# Patient Record
Sex: Female | Born: 1945 | Race: White | Hispanic: No | Marital: Single | Smoking: Former smoker
Health system: Southern US, Community
[De-identification: ages and names within clinical notes are randomized; demographics above are authoritative.]

## PROBLEM LIST (undated history)

## (undated) DIAGNOSIS — J449 Chronic obstructive pulmonary disease, unspecified: Secondary | ICD-10-CM

## (undated) DIAGNOSIS — F32A Depression, unspecified: Secondary | ICD-10-CM

## (undated) DIAGNOSIS — I739 Peripheral vascular disease, unspecified: Secondary | ICD-10-CM

## (undated) DIAGNOSIS — E785 Hyperlipidemia, unspecified: Secondary | ICD-10-CM

## (undated) DIAGNOSIS — F329 Major depressive disorder, single episode, unspecified: Secondary | ICD-10-CM

## (undated) DIAGNOSIS — E119 Type 2 diabetes mellitus without complications: Secondary | ICD-10-CM

## (undated) DIAGNOSIS — I10 Essential (primary) hypertension: Secondary | ICD-10-CM

---
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
## 2018-11-08 DIAGNOSIS — E119 Type 2 diabetes mellitus without complications: Secondary | ICD-10-CM

## 2018-11-08 DIAGNOSIS — N390 Urinary tract infection, site not specified: Secondary | ICD-10-CM

## 2018-11-08 DIAGNOSIS — R531 Weakness: Secondary | ICD-10-CM | POA: Diagnosis not present

## 2018-11-08 DIAGNOSIS — I10 Essential (primary) hypertension: Secondary | ICD-10-CM | POA: Diagnosis not present

## 2018-11-09 DIAGNOSIS — N390 Urinary tract infection, site not specified: Secondary | ICD-10-CM | POA: Diagnosis not present

## 2018-11-09 DIAGNOSIS — R531 Weakness: Secondary | ICD-10-CM | POA: Diagnosis not present

## 2018-11-09 DIAGNOSIS — E119 Type 2 diabetes mellitus without complications: Secondary | ICD-10-CM | POA: Diagnosis not present

## 2018-11-09 DIAGNOSIS — I10 Essential (primary) hypertension: Secondary | ICD-10-CM | POA: Diagnosis not present

## 2018-11-10 DIAGNOSIS — R531 Weakness: Secondary | ICD-10-CM | POA: Diagnosis not present

## 2018-11-10 DIAGNOSIS — E119 Type 2 diabetes mellitus without complications: Secondary | ICD-10-CM | POA: Diagnosis not present

## 2018-11-10 DIAGNOSIS — N390 Urinary tract infection, site not specified: Secondary | ICD-10-CM | POA: Diagnosis not present

## 2018-11-10 DIAGNOSIS — I10 Essential (primary) hypertension: Secondary | ICD-10-CM | POA: Diagnosis not present

## 2018-11-11 DIAGNOSIS — I10 Essential (primary) hypertension: Secondary | ICD-10-CM | POA: Diagnosis not present

## 2018-11-11 DIAGNOSIS — E119 Type 2 diabetes mellitus without complications: Secondary | ICD-10-CM | POA: Diagnosis not present

## 2018-11-11 DIAGNOSIS — N390 Urinary tract infection, site not specified: Secondary | ICD-10-CM | POA: Diagnosis not present

## 2018-11-11 DIAGNOSIS — R531 Weakness: Secondary | ICD-10-CM | POA: Diagnosis not present

## 2018-11-12 DIAGNOSIS — I10 Essential (primary) hypertension: Secondary | ICD-10-CM | POA: Diagnosis not present

## 2018-11-12 DIAGNOSIS — N390 Urinary tract infection, site not specified: Secondary | ICD-10-CM | POA: Diagnosis not present

## 2018-11-12 DIAGNOSIS — R531 Weakness: Secondary | ICD-10-CM | POA: Diagnosis not present

## 2018-11-12 DIAGNOSIS — E119 Type 2 diabetes mellitus without complications: Secondary | ICD-10-CM | POA: Diagnosis not present

## 2018-11-13 DIAGNOSIS — I10 Essential (primary) hypertension: Secondary | ICD-10-CM | POA: Diagnosis not present

## 2018-11-13 DIAGNOSIS — E119 Type 2 diabetes mellitus without complications: Secondary | ICD-10-CM | POA: Diagnosis not present

## 2018-11-13 DIAGNOSIS — R531 Weakness: Secondary | ICD-10-CM | POA: Diagnosis not present

## 2018-11-13 DIAGNOSIS — N390 Urinary tract infection, site not specified: Secondary | ICD-10-CM | POA: Diagnosis not present

## 2019-04-26 HISTORY — PX: ABDOMINOPLASTY: SHX5355

## 2019-05-02 ENCOUNTER — Encounter (HOSPITAL_COMMUNITY): Payer: Self-pay | Admitting: Internal Medicine

## 2019-05-02 ENCOUNTER — Inpatient Hospital Stay (HOSPITAL_COMMUNITY)
Admission: AD | Admit: 2019-05-02 | Discharge: 2019-05-15 | DRG: 233 | Disposition: A | Discharge status: Skilled Nursing Facility | Payer: Medicare Other | Source: Other Acute Inpatient Hospital | Attending: Thoracic Surgery (Cardiothoracic Vascular Surgery) | Admitting: Thoracic Surgery (Cardiothoracic Vascular Surgery)

## 2019-05-02 DIAGNOSIS — I878 Other specified disorders of veins: Secondary | ICD-10-CM | POA: Diagnosis present

## 2019-05-02 DIAGNOSIS — D72829 Elevated white blood cell count, unspecified: Secondary | ICD-10-CM | POA: Diagnosis not present

## 2019-05-02 DIAGNOSIS — J449 Chronic obstructive pulmonary disease, unspecified: Secondary | ICD-10-CM | POA: Insufficient documentation

## 2019-05-02 DIAGNOSIS — N189 Chronic kidney disease, unspecified: Secondary | ICD-10-CM | POA: Diagnosis present

## 2019-05-02 DIAGNOSIS — I130 Hypertensive heart and chronic kidney disease with heart failure and stage 1 through stage 4 chronic kidney disease, or unspecified chronic kidney disease: Secondary | ICD-10-CM | POA: Diagnosis present

## 2019-05-02 DIAGNOSIS — J189 Pneumonia, unspecified organism: Secondary | ICD-10-CM | POA: Diagnosis present

## 2019-05-02 DIAGNOSIS — F329 Major depressive disorder, single episode, unspecified: Secondary | ICD-10-CM | POA: Diagnosis present

## 2019-05-02 DIAGNOSIS — Z9862 Peripheral vascular angioplasty status: Secondary | ICD-10-CM | POA: Diagnosis not present

## 2019-05-02 DIAGNOSIS — E11628 Type 2 diabetes mellitus with other skin complications: Secondary | ICD-10-CM | POA: Diagnosis not present

## 2019-05-02 DIAGNOSIS — M1990 Unspecified osteoarthritis, unspecified site: Secondary | ICD-10-CM | POA: Diagnosis present

## 2019-05-02 DIAGNOSIS — E1122 Type 2 diabetes mellitus with diabetic chronic kidney disease: Secondary | ICD-10-CM | POA: Diagnosis not present

## 2019-05-02 DIAGNOSIS — Z79899 Other long term (current) drug therapy: Secondary | ICD-10-CM | POA: Diagnosis not present

## 2019-05-02 DIAGNOSIS — I214 Non-ST elevation (NSTEMI) myocardial infarction: Principal | ICD-10-CM | POA: Diagnosis present

## 2019-05-02 DIAGNOSIS — E669 Obesity, unspecified: Secondary | ICD-10-CM | POA: Diagnosis present

## 2019-05-02 DIAGNOSIS — Z951 Presence of aortocoronary bypass graft: Secondary | ICD-10-CM | POA: Diagnosis not present

## 2019-05-02 DIAGNOSIS — Z6834 Body mass index (BMI) 34.0-34.9, adult: Secondary | ICD-10-CM | POA: Diagnosis not present

## 2019-05-02 DIAGNOSIS — I25119 Atherosclerotic heart disease of native coronary artery with unspecified angina pectoris: Secondary | ICD-10-CM | POA: Diagnosis present

## 2019-05-02 DIAGNOSIS — Z9889 Other specified postprocedural states: Secondary | ICD-10-CM | POA: Diagnosis not present

## 2019-05-02 DIAGNOSIS — J9621 Acute and chronic respiratory failure with hypoxia: Secondary | ICD-10-CM | POA: Diagnosis not present

## 2019-05-02 DIAGNOSIS — Z8249 Family history of ischemic heart disease and other diseases of the circulatory system: Secondary | ICD-10-CM

## 2019-05-02 DIAGNOSIS — N136 Pyonephrosis: Secondary | ICD-10-CM | POA: Diagnosis present

## 2019-05-02 DIAGNOSIS — Z01810 Encounter for preprocedural cardiovascular examination: Secondary | ICD-10-CM | POA: Diagnosis not present

## 2019-05-02 DIAGNOSIS — E8779 Other fluid overload: Secondary | ICD-10-CM | POA: Diagnosis not present

## 2019-05-02 DIAGNOSIS — D649 Anemia, unspecified: Secondary | ICD-10-CM | POA: Diagnosis not present

## 2019-05-02 DIAGNOSIS — I739 Peripheral vascular disease, unspecified: Secondary | ICD-10-CM

## 2019-05-02 DIAGNOSIS — I10 Essential (primary) hypertension: Secondary | ICD-10-CM | POA: Diagnosis present

## 2019-05-02 DIAGNOSIS — R001 Bradycardia, unspecified: Secondary | ICD-10-CM | POA: Diagnosis not present

## 2019-05-02 DIAGNOSIS — L89899 Pressure ulcer of other site, unspecified stage: Secondary | ICD-10-CM | POA: Diagnosis present

## 2019-05-02 DIAGNOSIS — I2510 Atherosclerotic heart disease of native coronary artery without angina pectoris: Secondary | ICD-10-CM

## 2019-05-02 DIAGNOSIS — D62 Acute posthemorrhagic anemia: Secondary | ICD-10-CM | POA: Diagnosis not present

## 2019-05-02 DIAGNOSIS — K219 Gastro-esophageal reflux disease without esophagitis: Secondary | ICD-10-CM | POA: Diagnosis not present

## 2019-05-02 DIAGNOSIS — N1330 Unspecified hydronephrosis: Secondary | ICD-10-CM | POA: Diagnosis not present

## 2019-05-02 DIAGNOSIS — M1711 Unilateral primary osteoarthritis, right knee: Secondary | ICD-10-CM | POA: Diagnosis not present

## 2019-05-02 DIAGNOSIS — L8990 Pressure ulcer of unspecified site, unspecified stage: Secondary | ICD-10-CM | POA: Insufficient documentation

## 2019-05-02 DIAGNOSIS — E1140 Type 2 diabetes mellitus with diabetic neuropathy, unspecified: Secondary | ICD-10-CM | POA: Diagnosis present

## 2019-05-02 DIAGNOSIS — F32A Depression, unspecified: Secondary | ICD-10-CM | POA: Insufficient documentation

## 2019-05-02 DIAGNOSIS — E1151 Type 2 diabetes mellitus with diabetic peripheral angiopathy without gangrene: Secondary | ICD-10-CM | POA: Diagnosis present

## 2019-05-02 DIAGNOSIS — R0602 Shortness of breath: Secondary | ICD-10-CM | POA: Diagnosis present

## 2019-05-02 DIAGNOSIS — Z794 Long term (current) use of insulin: Secondary | ICD-10-CM | POA: Diagnosis not present

## 2019-05-02 DIAGNOSIS — I5032 Chronic diastolic (congestive) heart failure: Secondary | ICD-10-CM | POA: Diagnosis present

## 2019-05-02 DIAGNOSIS — R5381 Other malaise: Secondary | ICD-10-CM | POA: Diagnosis not present

## 2019-05-02 DIAGNOSIS — E119 Type 2 diabetes mellitus without complications: Secondary | ICD-10-CM | POA: Diagnosis not present

## 2019-05-02 DIAGNOSIS — E785 Hyperlipidemia, unspecified: Secondary | ICD-10-CM | POA: Diagnosis present

## 2019-05-02 DIAGNOSIS — E860 Dehydration: Secondary | ICD-10-CM | POA: Diagnosis not present

## 2019-05-02 DIAGNOSIS — Z1159 Encounter for screening for other viral diseases: Secondary | ICD-10-CM

## 2019-05-02 DIAGNOSIS — I252 Old myocardial infarction: Secondary | ICD-10-CM

## 2019-05-02 DIAGNOSIS — I110 Hypertensive heart disease with heart failure: Secondary | ICD-10-CM | POA: Diagnosis not present

## 2019-05-02 DIAGNOSIS — J440 Chronic obstructive pulmonary disease with acute lower respiratory infection: Secondary | ICD-10-CM | POA: Diagnosis not present

## 2019-05-02 DIAGNOSIS — Z880 Allergy status to penicillin: Secondary | ICD-10-CM

## 2019-05-02 DIAGNOSIS — I25110 Atherosclerotic heart disease of native coronary artery with unstable angina pectoris: Secondary | ICD-10-CM | POA: Diagnosis not present

## 2019-05-02 DIAGNOSIS — J9601 Acute respiratory failure with hypoxia: Secondary | ICD-10-CM | POA: Insufficient documentation

## 2019-05-02 DIAGNOSIS — N179 Acute kidney failure, unspecified: Secondary | ICD-10-CM | POA: Diagnosis present

## 2019-05-02 DIAGNOSIS — N12 Tubulo-interstitial nephritis, not specified as acute or chronic: Secondary | ICD-10-CM | POA: Diagnosis not present

## 2019-05-02 DIAGNOSIS — I509 Heart failure, unspecified: Secondary | ICD-10-CM | POA: Diagnosis not present

## 2019-05-02 DIAGNOSIS — J9811 Atelectasis: Secondary | ICD-10-CM

## 2019-05-02 DIAGNOSIS — K5900 Constipation, unspecified: Secondary | ICD-10-CM | POA: Diagnosis not present

## 2019-05-02 DIAGNOSIS — Z87891 Personal history of nicotine dependence: Secondary | ICD-10-CM

## 2019-05-02 HISTORY — DX: Depression, unspecified: F32.A

## 2019-05-02 HISTORY — DX: Peripheral vascular disease, unspecified: I73.9

## 2019-05-02 HISTORY — DX: Chronic obstructive pulmonary disease, unspecified: J44.9

## 2019-05-02 HISTORY — DX: Hyperlipidemia, unspecified: E78.5

## 2019-05-02 HISTORY — DX: Essential (primary) hypertension: I10

## 2019-05-02 HISTORY — DX: Type 2 diabetes mellitus without complications: E11.9

## 2019-05-02 MED ORDER — BISOPROLOL FUMARATE 5 MG PO TABS
2.5000 mg | ORAL_TABLET | Freq: Every day | ORAL | Status: DC
  Administered 2019-05-03 – 2019-05-08 (×6): 2.5 mg via ORAL
  Filled 2019-05-02 (×6): qty 1

## 2019-05-02 MED ORDER — IPRATROPIUM-ALBUTEROL 20-100 MCG/ACT IN AERS: 1.0000 | INHALATION_SPRAY | Freq: Four times a day (QID) | RESPIRATORY_TRACT | Status: DC | PRN

## 2019-05-02 MED ORDER — SENNOSIDES-DOCUSATE SODIUM 8.6-50 MG PO TABS
1.0000 | ORAL_TABLET | Freq: Every day | ORAL | Status: DC
  Administered 2019-05-03 – 2019-05-08 (×6): 1 via ORAL
  Filled 2019-05-02 (×7): qty 1

## 2019-05-02 MED ORDER — LISINOPRIL 40 MG PO TABS: 40.0000 mg | ORAL_TABLET | Freq: Every day | ORAL | Status: DC

## 2019-05-02 MED ORDER — INSULIN ASPART 100 UNIT/ML ~~LOC~~ SOLN
3.0000 [IU] | Freq: Three times a day (TID) | SUBCUTANEOUS | Status: DC
  Administered 2019-05-03 – 2019-05-08 (×13): 3 [IU] via SUBCUTANEOUS

## 2019-05-02 MED ORDER — ASPIRIN EC 81 MG PO TBEC
81.0000 mg | DELAYED_RELEASE_TABLET | Freq: Every day | ORAL | Status: DC
  Administered 2019-05-03 – 2019-05-07 (×5): 81 mg via ORAL
  Filled 2019-05-02 (×6): qty 1

## 2019-05-02 MED ORDER — POLYETHYLENE GLYCOL 3350 17 G PO PACK
17.0000 g | PACK | Freq: Every day | ORAL | Status: DC
  Administered 2019-05-04: 17 g via ORAL
  Filled 2019-05-02 (×6): qty 1

## 2019-05-02 MED ORDER — ACETAMINOPHEN 650 MG RE SUPP
650.0000 mg | Freq: Four times a day (QID) | RECTAL | Status: DC | PRN
  Administered 2019-05-03: 650 mg via RECTAL
  Filled 2019-05-02: qty 1

## 2019-05-02 MED ORDER — INSULIN ASPART 100 UNIT/ML ~~LOC~~ SOLN
0.0000 [IU] | Freq: Three times a day (TID) | SUBCUTANEOUS | Status: DC
  Administered 2019-05-03: 2 [IU] via SUBCUTANEOUS
  Administered 2019-05-03: 1 [IU] via SUBCUTANEOUS
  Administered 2019-05-03: 2 [IU] via SUBCUTANEOUS
  Administered 2019-05-04: 3 [IU] via SUBCUTANEOUS
  Administered 2019-05-04: 2 [IU] via SUBCUTANEOUS
  Administered 2019-05-04 – 2019-05-05 (×2): 3 [IU] via SUBCUTANEOUS
  Administered 2019-05-05 (×2): 2 [IU] via SUBCUTANEOUS
  Administered 2019-05-06 – 2019-05-07 (×4): 3 [IU] via SUBCUTANEOUS

## 2019-05-02 MED ORDER — CLOPIDOGREL BISULFATE 75 MG PO TABS
75.0000 mg | ORAL_TABLET | Freq: Every day | ORAL | Status: DC
  Administered 2019-05-03 – 2019-05-07 (×5): 75 mg via ORAL
  Filled 2019-05-02 (×5): qty 1

## 2019-05-02 MED ORDER — ACETAMINOPHEN 325 MG PO TABS
650.0000 mg | ORAL_TABLET | Freq: Four times a day (QID) | ORAL | Status: DC | PRN
  Administered 2019-05-06 – 2019-05-08 (×3): 650 mg via ORAL
  Filled 2019-05-02 (×3): qty 2

## 2019-05-02 MED ORDER — ROSUVASTATIN CALCIUM 20 MG PO TABS
20.0000 mg | ORAL_TABLET | Freq: Every day | ORAL | Status: DC
  Administered 2019-05-03 – 2019-05-07 (×6): 20 mg via ORAL
  Filled 2019-05-02 (×6): qty 1

## 2019-05-02 MED ORDER — INSULIN ASPART 100 UNIT/ML ~~LOC~~ SOLN: 0.0000 [IU] | Freq: Every day | SUBCUTANEOUS | Status: DC

## 2019-05-02 NOTE — H&P (Addendum)
Date: 05/03/2019               Patient Name:  Stephanie [name] MRN: [id number]  DOB: Feb 19, 1946 Age / Sex: 73 y.o., female   PCP: Hedwig Morton, NP         Medical Service: Internal Medicine Teaching Service         Attending Physician: Dr. Aldine Contes, MD    First Contact: Dr. Myrtie Hawk Pager: [phone]  Second Contact: Dr. Maricela Bo Pager: [phone]       After Hours (After 5p/  First Contact Pager: [phone]  weekends / holidays): Second Contact Pager: [phone]   Chief Complaint: shortness of breath   History of Present Illness:  Stephanie [name]. [name] is a 73 yo female living at [employer] with CHF (unclear diastolic vs. Systolic), right knee arthritis, UTIs, HTN, COPD, PVD with recent right leg angioplasty, and TIIDM and history of PUD admitted to [facility] earlier today with three days of feeling weak and having shortness of breath. At [facility], initial covid testing was negative, her troponin was found to be elevated, and heparin drip was started. Repeat COVID testing was ordered as there is a recent covid outbreak at [facility]. This was negative. She was transferred to [facility] referral. After transfer, the IMTS service was called to see the patient.   [name] states that for the last 3 days to week she has been having ongoing shortness of breath and weakness without cough. She denies chest pain, nausea, tingling or pain in her arm or jaw. She denies orthopnea. She is not sure how far she can walk although she knows its decreased from usual. She denies changes in urination, dysuria, or decreased or increased frequency. She denies changes in bowel movements although she feels bloated whenever she eats. She last had a bowel movement yesterday.  She has a dressing in place over her right upper thigh and states one week ago she thinks she had an angioplasty with stents placed in her RLE as she had an arterial clot there. She was on plavis for  this and recently completed the course several days ago, she is not sure if she is taking aspirin. She denies right or left leg pain but does have chronic right knee pain that is very tender. She denies numbness in her LEs.   Further history obtained from [place] chart review. For complete details see chart pages in media tab or physical chart in 2W. Prior to admission she was treated with clindamycin and doxycycline for several days for suspected pneumonia. Once brought to [facility] ED she was treated with one dose of cefepime for leukocytosis in addition to medications mentioned above.   Social:  She lives at [employer].  She stopped smoking in 1984.  She occasionally has a bud ice, which helps settle her stomach.   Family History:  Parents - both died from heart disease at older ages  Son - died from MI at 26   Meds:  No outpatient medications have been marked as taking for the 05/02/19 encounter [facility] Encounter).  Topiramate 25 mg po hs sumatriptain succinate 50 mg po as directed crestor 10 mg qd  Paroxetine er 75 mg qam Mupirocin top daily  Lisinopril 40 mg bid  xultophy 16U qd Gabapentin 600 bid  Fexofenadine 180 mg qd Famotidine 10 mg qd Diclofenac gel plavix 75 mg qd Bisoprolol fumarate 2.5 mg qd Benzethonium chloride topical qd  Asa 81 mg qd Meclizine 12.5 mg q6h prn dizziness Hydralazine 25 mg q8h flonase 2 sprays qd Acetaminophen 650 mg q6h prn   Allergies: Allergies as of 05/02/2019 - never reviewed  Allergen Reaction Noted  . Penicillins  05/02/2019   Past Medical History:  Diagnosis Date  . COPD (chronic obstructive pulmonary disease) (HCC)   . Depression   . Diabetes mellitus without complication (HCC)   . HTN (hypertension)     Review of Systems: A complete ROS was negative except as per HPI.   Physical Exam: Blood pressure (!) 146/60, pulse 85, temperature 98.3 F (36.8 C), temperature source Oral, resp. rate 16, weight 79.4  kg, SpO2 98 %.  Constitution: NAD, supine in bed  HENT: [place]/AT Eyes: eom intact, no icterus or injection  Cardio: RRR, no m/r/g, no JVD, no LE edema, extremities warm, pedal pulses not palpated Respiratory: CTA, no wheezing rales or rhonchi, non-labored breathing, on RA  Abdominal: soft, non-distended, diffusely TTP MSK: moving all extremities, right knee TTP lateral and medially Neuro: a&ox4, normal affect  Skin: chronic LE changes, 2 left LE ulcers without purulence of erythema, 1.5x1.5cm  Troponin: 1.35 > 1.74 Pro-BNP: 2,310  EKG: personally reviewed my interpretation is LVH, st elevation aVR, depression aVL, T-wave changes lateral leads.   [facility]: Image not available  Text: No evidence of acute disease  [facility] not [facility] Borderline hydronephrosis right kidney. Delayed visualization of the right renal collection system and ureter. No calculi but question recent passage. Pyelonephritis cannot be ruled out. No perinephric fluids, stranding, or abscess.  Rectum distended with stool   UA [place]: few bacteria, nitrate negative, leukocyte negative   Assessment & Plan by Problem: Principal Problem:   NSTEMI (non-ST elevated myocardial infarction) (HCC) Active Problems:   COPD (chronic obstructive pulmonary disease) (HCC)   Depression   Diabetes mellitus without complication (HCC)   HTN (hypertension)   Acute on chronic respiratory failure with hypoxia (HCC)   Leukocytosis   Acute hypoxemic respiratory failure (HCC)  73 yo female living at [facility] with CHF (unclear diastolic vs. Systolic), right knee arthritis, UTIs, HTN, COPD, PVD with recent right leg angioplasty, and TIIDM and history of PUD admitted [facility] earlier today for leukocytosis and transferred to [facility] for NSTEMI.   NSTEMI CHF [facility]ed [facility]. Asymptomatic with st elevation and T wave changes on EKG and initial troponin elevation 1.35>1.74.  History of CHF, HTN, and peripheral vascular disease.   - trend troponins - consult cardiology  - repeat EKG - EKG q6h - cont. Heparin  - lipid panel  - ECHO  - cont. crestor  Hypertension PVD With Recent Right Angioplasty Blood pressure mildly elevated.   - cont. Home Bisoprolol 2.5 mg qd  - hold hydralazine - hold lisinopril with CKD - cont. Asa 81 mg - plavix 75 mg qd   Leukocytosis Acute Kidney Injury Possible pyelonephritis with hydronephrosis 2/2 recent obstruction on CT, AKI present likely post-renal. Lab values inconsistent with covid and symptoms atypical except for shortness of breath which may be secondary to nstemi. Placed on droplet/contact precautions while repeat testing pending. She also has two LLE ulcers 1.5x1.5cm that do not appear infected.   - f/u repeat covid  - start meropenem with broad coverage for pyelonephritis with possible obstruction  - repeat UA - Ur Na, Ur Cr - CBC, CMP - blood cultures, urine culture     Type II DM  Takes  xultophy 16U   - SSI qhs  - SSI sensitive, 3U tid - hemoglobin A1C  Constipation - miralax, senokot   COPD - duonebs q6h prn. Not on an inhaler at home GERD - cont. Home pepcid    Diet: NPO  VTE: heparin IVF: none Code: full   Dispo: Admit patient to Inpatient with expected length of stay greater than 2 midnights.  SignedVersie Starks: Latara Micheli A, DO 05/03/2019, 4:13 AM  Pager: [phone]

## 2019-05-03 ENCOUNTER — Other Ambulatory Visit: Payer: Self-pay

## 2019-05-03 ENCOUNTER — Encounter (HOSPITAL_COMMUNITY): Payer: Self-pay

## 2019-05-03 ENCOUNTER — Inpatient Hospital Stay (HOSPITAL_COMMUNITY): Payer: Medicare Other

## 2019-05-03 DIAGNOSIS — Z9862 Peripheral vascular angioplasty status: Secondary | ICD-10-CM

## 2019-05-03 DIAGNOSIS — K219 Gastro-esophageal reflux disease without esophagitis: Secondary | ICD-10-CM

## 2019-05-03 DIAGNOSIS — I110 Hypertensive heart disease with heart failure: Secondary | ICD-10-CM

## 2019-05-03 DIAGNOSIS — K5900 Constipation, unspecified: Secondary | ICD-10-CM

## 2019-05-03 DIAGNOSIS — E1151 Type 2 diabetes mellitus with diabetic peripheral angiopathy without gangrene: Secondary | ICD-10-CM

## 2019-05-03 DIAGNOSIS — Z8711 Personal history of peptic ulcer disease: Secondary | ICD-10-CM

## 2019-05-03 DIAGNOSIS — Z87440 Personal history of urinary (tract) infections: Secondary | ICD-10-CM

## 2019-05-03 DIAGNOSIS — L8990 Pressure ulcer of unspecified site, unspecified stage: Secondary | ICD-10-CM | POA: Insufficient documentation

## 2019-05-03 DIAGNOSIS — M1711 Unilateral primary osteoarthritis, right knee: Secondary | ICD-10-CM

## 2019-05-03 DIAGNOSIS — I509 Heart failure, unspecified: Secondary | ICD-10-CM

## 2019-05-03 DIAGNOSIS — Z880 Allergy status to penicillin: Secondary | ICD-10-CM

## 2019-05-03 DIAGNOSIS — R0602 Shortness of breath: Secondary | ICD-10-CM

## 2019-05-03 DIAGNOSIS — Z87891 Personal history of nicotine dependence: Secondary | ICD-10-CM

## 2019-05-03 DIAGNOSIS — N1330 Unspecified hydronephrosis: Secondary | ICD-10-CM

## 2019-05-03 DIAGNOSIS — D72829 Elevated white blood cell count, unspecified: Secondary | ICD-10-CM

## 2019-05-03 DIAGNOSIS — Z794 Long term (current) use of insulin: Secondary | ICD-10-CM

## 2019-05-03 DIAGNOSIS — I214 Non-ST elevation (NSTEMI) myocardial infarction: Principal | ICD-10-CM

## 2019-05-03 DIAGNOSIS — J449 Chronic obstructive pulmonary disease, unspecified: Secondary | ICD-10-CM

## 2019-05-03 DIAGNOSIS — J9621 Acute and chronic respiratory failure with hypoxia: Secondary | ICD-10-CM

## 2019-05-03 DIAGNOSIS — N179 Acute kidney failure, unspecified: Secondary | ICD-10-CM

## 2019-05-03 DIAGNOSIS — Z79899 Other long term (current) drug therapy: Secondary | ICD-10-CM

## 2019-05-03 LAB — CBC
HCT: 28.2 % — ABNORMAL LOW (ref 36.0–46.0)
Hemoglobin: 9.3 g/dL — ABNORMAL LOW (ref 12.0–15.0)
MCH: 30.6 pg (ref 26.0–34.0)
MCHC: 33 g/dL (ref 30.0–36.0)
MCV: 92.8 fL (ref 80.0–100.0)
Platelets: 277 10*3/uL (ref 150–400)
RBC: 3.04 MIL/uL — ABNORMAL LOW (ref 3.87–5.11)
RDW: 12.2 % (ref 11.5–15.5)
WBC: 16.5 10*3/uL — ABNORMAL HIGH (ref 4.0–10.5)
nRBC: 0 % (ref 0.0–0.2)

## 2019-05-03 LAB — COMPREHENSIVE METABOLIC PANEL
ALT: 28 U/L (ref 0–44)
AST: 24 U/L (ref 15–41)
Albumin: 2.9 g/dL — ABNORMAL LOW (ref 3.5–5.0)
Alkaline Phosphatase: 79 U/L (ref 38–126)
Anion gap: 10 (ref 5–15)
BUN: 30 mg/dL — ABNORMAL HIGH (ref 8–23)
CO2: 21 mmol/L — ABNORMAL LOW (ref 22–32)
Calcium: 9.2 mg/dL (ref 8.9–10.3)
Chloride: 102 mmol/L (ref 98–111)
Creatinine, Ser: 1.61 mg/dL — ABNORMAL HIGH (ref 0.44–1.00)
GFR calc Af Amer: 37 mL/min — ABNORMAL LOW (ref >60)
GFR calc non Af Amer: 32 mL/min — ABNORMAL LOW (ref >60)
Glucose, Bld: 165 mg/dL — ABNORMAL HIGH (ref 70–99)
Potassium: 4.1 mmol/L (ref 3.5–5.1)
Sodium: 133 mmol/L — ABNORMAL LOW (ref 135–145)
Total Bilirubin: 0.6 mg/dL (ref 0.3–1.2)
Total Protein: 6.6 g/dL (ref 6.5–8.1)

## 2019-05-03 LAB — LIPID PANEL
Cholesterol: 104 mg/dL (ref 0–200)
HDL: 38 mg/dL — ABNORMAL LOW (ref >40)
LDL Cholesterol: 37 mg/dL (ref 0–99)
Total CHOL/HDL Ratio: 2.7 RATIO
Triglycerides: 145 mg/dL (ref <150)
VLDL: 29 mg/dL (ref 0–40)

## 2019-05-03 LAB — CBC WITH DIFFERENTIAL/PLATELET
Abs Immature Granulocytes: 0.05 10*3/uL (ref 0.00–0.07)
Basophils Absolute: 0 10*3/uL (ref 0.0–0.1)
Basophils Relative: 0 %
Eosinophils Absolute: 0.2 10*3/uL (ref 0.0–0.5)
Eosinophils Relative: 1 %
HCT: 30.7 % — ABNORMAL LOW (ref 36.0–46.0)
Hemoglobin: 10.1 g/dL — ABNORMAL LOW (ref 12.0–15.0)
Immature Granulocytes: 0 %
Lymphocytes Relative: 12 %
Lymphs Abs: 1.8 10*3/uL (ref 0.7–4.0)
MCH: 30.6 pg (ref 26.0–34.0)
MCHC: 32.9 g/dL (ref 30.0–36.0)
MCV: 93 fL (ref 80.0–100.0)
Monocytes Absolute: 1.3 10*3/uL — ABNORMAL HIGH (ref 0.1–1.0)
Monocytes Relative: 8 %
Neutro Abs: 12.7 10*3/uL — ABNORMAL HIGH (ref 1.7–7.7)
Neutrophils Relative %: 79 %
Platelets: 287 10*3/uL (ref 150–400)
RBC: 3.3 MIL/uL — ABNORMAL LOW (ref 3.87–5.11)
RDW: 12.3 % (ref 11.5–15.5)
WBC: 16.1 10*3/uL — ABNORMAL HIGH (ref 4.0–10.5)
nRBC: 0 % (ref 0.0–0.2)

## 2019-05-03 LAB — BASIC METABOLIC PANEL
Anion gap: 9 (ref 5–15)
BUN: 29 mg/dL — ABNORMAL HIGH (ref 8–23)
CO2: 20 mmol/L — ABNORMAL LOW (ref 22–32)
Calcium: 9.1 mg/dL (ref 8.9–10.3)
Chloride: 105 mmol/L (ref 98–111)
Creatinine, Ser: 1.48 mg/dL — ABNORMAL HIGH (ref 0.44–1.00)
GFR calc Af Amer: 41 mL/min — ABNORMAL LOW (ref >60)
GFR calc non Af Amer: 35 mL/min — ABNORMAL LOW (ref >60)
Glucose, Bld: 179 mg/dL — ABNORMAL HIGH (ref 70–99)
Potassium: 4.3 mmol/L (ref 3.5–5.1)
Sodium: 134 mmol/L — ABNORMAL LOW (ref 135–145)

## 2019-05-03 LAB — URINALYSIS, ROUTINE W REFLEX MICROSCOPIC
Bilirubin Urine: NEGATIVE
Glucose, UA: >=500 mg/dL — AB
Hgb urine dipstick: NEGATIVE
Ketones, ur: NEGATIVE mg/dL
Leukocytes,Ua: NEGATIVE
Nitrite: NEGATIVE
Protein, ur: NEGATIVE mg/dL
Specific Gravity, Urine: 1.02 (ref 1.005–1.030)
pH: 6 (ref 5.0–8.0)

## 2019-05-03 LAB — SARS CORONAVIRUS 2 BY RT PCR (HOSPITAL ORDER, PERFORMED IN ~~LOC~~ HOSPITAL LAB): SARS Coronavirus 2: NEGATIVE

## 2019-05-03 LAB — ECHOCARDIOGRAM COMPLETE: Weight: 2800 oz

## 2019-05-03 LAB — HEPARIN LEVEL (UNFRACTIONATED)
Heparin Unfractionated: <0.1 IU/mL — ABNORMAL LOW (ref 0.30–0.70)
Heparin Unfractionated: <0.1 IU/mL — ABNORMAL LOW (ref 0.30–0.70)
Heparin Unfractionated: 0.17 IU/mL — ABNORMAL LOW (ref 0.30–0.70)

## 2019-05-03 LAB — MAGNESIUM: Magnesium: 1.9 mg/dL (ref 1.7–2.4)

## 2019-05-03 LAB — CREATININE, URINE, RANDOM: Creatinine, Urine: 72.24 mg/dL

## 2019-05-03 LAB — TROPONIN I (HIGH SENSITIVITY)
Troponin I (High Sensitivity): 609 ng/L (ref <18)
Troponin I (High Sensitivity): 755 ng/L (ref <18)

## 2019-05-03 LAB — GLUCOSE, CAPILLARY
Glucose-Capillary: 132 mg/dL — ABNORMAL HIGH (ref 70–99)
Glucose-Capillary: 139 mg/dL — ABNORMAL HIGH (ref 70–99)
Glucose-Capillary: 151 mg/dL — ABNORMAL HIGH (ref 70–99)
Glucose-Capillary: 164 mg/dL — ABNORMAL HIGH (ref 70–99)
Glucose-Capillary: 184 mg/dL — ABNORMAL HIGH (ref 70–99)

## 2019-05-03 LAB — HEMOGLOBIN A1C
Hgb A1c MFr Bld: 7.4 % — ABNORMAL HIGH (ref 4.8–5.6)
Mean Plasma Glucose: 165.68 mg/dL

## 2019-05-03 LAB — GRAM STAIN

## 2019-05-03 LAB — MRSA PCR SCREENING: MRSA by PCR: NEGATIVE

## 2019-05-03 LAB — SODIUM, URINE, RANDOM: Sodium, Ur: 75 mmol/L

## 2019-05-03 MED ORDER — GABAPENTIN 100 MG PO CAPS
200.0000 mg | ORAL_CAPSULE | Freq: Two times a day (BID) | ORAL | Status: DC | PRN
  Administered 2019-05-06: 200 mg via ORAL
  Filled 2019-05-03: qty 2

## 2019-05-03 MED ORDER — SODIUM CHLORIDE 0.9 % IV SOLN
1.0000 g | INTRAVENOUS | Status: DC
  Administered 2019-05-04 – 2019-05-10 (×5): 1 g via INTRAVENOUS
  Filled 2019-05-03 (×2): qty 10
  Filled 2019-05-03 (×3): qty 1
  Filled 2019-05-03 (×2): qty 10
  Filled 2019-05-03: qty 1

## 2019-05-03 MED ORDER — HEPARIN (PORCINE) 25000 UT/250ML-% IV SOLN
1900.0000 [IU]/h | INTRAVENOUS | Status: DC
  Administered 2019-05-03: 1400 [IU]/h via INTRAVENOUS
  Administered 2019-05-03: 800 [IU]/h via INTRAVENOUS
  Administered 2019-05-04: 1600 [IU]/h via INTRAVENOUS
  Administered 2019-05-05: 1700 [IU]/h via INTRAVENOUS
  Administered 2019-05-06 – 2019-05-07 (×2): 1900 [IU]/h via INTRAVENOUS
  Filled 2019-05-03 (×6): qty 250

## 2019-05-03 MED ORDER — IPRATROPIUM-ALBUTEROL 0.5-2.5 (3) MG/3ML IN SOLN: 3.0000 mL | Freq: Four times a day (QID) | RESPIRATORY_TRACT | Status: DC | PRN

## 2019-05-03 MED ORDER — TOPIRAMATE 25 MG PO TABS
25.0000 mg | ORAL_TABLET | Freq: Every day | ORAL | Status: DC
  Administered 2019-05-03 – 2019-05-08 (×6): 25 mg via ORAL
  Filled 2019-05-03 (×7): qty 1

## 2019-05-03 MED ORDER — HEPARIN BOLUS VIA INFUSION
2000.0000 [IU] | Freq: Once | INTRAVENOUS | Status: AC
  Administered 2019-05-03: 2000 [IU] via INTRAVENOUS
  Filled 2019-05-03: qty 2000

## 2019-05-03 MED ORDER — HEPARIN BOLUS VIA INFUSION
1000.0000 [IU] | Freq: Once | INTRAVENOUS | Status: AC
  Administered 2019-05-03: 1000 [IU] via INTRAVENOUS
  Filled 2019-05-03: qty 1000

## 2019-05-03 MED ORDER — SODIUM CHLORIDE 0.9 % IV SOLN
1.0000 g | Freq: Two times a day (BID) | INTRAVENOUS | Status: AC
  Administered 2019-05-03 (×3): 1 g via INTRAVENOUS
  Filled 2019-05-03 (×3): qty 1

## 2019-05-03 MED ORDER — FAMOTIDINE 20 MG PO TABS
10.0000 mg | ORAL_TABLET | Freq: Every day | ORAL | Status: DC
  Administered 2019-05-03 – 2019-05-08 (×6): 10 mg via ORAL
  Filled 2019-05-03 (×6): qty 1

## 2019-05-03 NOTE — Progress Notes (Signed)
  Echocardiogram 2D Echocardiogram has been performed.  Stephanie [name] 05/03/2019, 8:58 AM

## 2019-05-03 NOTE — Consult Note (Addendum)
Cardiology Consultation:   Patient ID: Stephanie [name] MRN: [id number]; DOB: 1946-08-16  Admit date: 05/02/2019 Date of Consult: 05/03/2019  Primary Care Provider: Levin ErpJones, Brittney M, NP Primary Cardiologist: Dr. Anne FuSkains Primary Electrophysiologist:  None    Patient Profile:   Stephanie [name] is a 73 y.o. female with a hx of CHF, UTIs, arthritis, HTN, COPD, PVD right leg with recent angioplasty on Plavix , Type 2 DM who is being seen today for the evaluation of elevated troponin at the request of Dr. Heide SparkNarendra  History of Present Illness:   Stephanie [name] is a resident at [facility]. She noted worsening sob 4 days ago as well as weakness and was [facility]. She was being treated for pneumonia on abx. She denied any chest pain or orthopnea.  COVID test negative. Leukocytes were elevated at 16 and abdominal CT suggested pylonephritis and was therefore started on abx. BNP was 2310.  Troponin was elevated at 755. EKG showed signs of ischemia T wave inversion in I and avL and was [facility]. She was started on heparin drip and treated with ASA, bisoprolol, and plavix.   Creatinine has improved 1.61>>1.48. Troponin trending down 755>>609. Echo pending.   Patient is a poor historian. She states she is feeling better today, improved sob. She denies chest pain or palpitations. She denies recent leg edema. [place].  Heart Pathway Score:     Past Medical History:  Diagnosis Date  . COPD (chronic obstructive pulmonary disease) (HCC)   . Depression   . Diabetes mellitus without complication (HCC)   . HTN (hypertension)   . Hyperlipidemia   . PVD (peripheral vascular disease) (HCC)     Past Surgical History:  Procedure Laterality Date  . ABDOMINOPLASTY Right 04/26/2019   Verbalized per patient      Home Medications:  Prior to Admission medications   Not on File    Inpatient Medications: Scheduled Meds:  . aspirin EC  81 mg Oral Daily  . bisoprolol  2.5 mg Oral Daily  . clopidogrel  75 mg Oral Daily  . famotidine  10 mg Oral Daily  . insulin aspart  0-5 Units Subcutaneous QHS  . insulin aspart  0-9 Units Subcutaneous TID WC  . insulin aspart  3 Units Subcutaneous TID WC  . polyethylene glycol  17 g Oral Daily  . rosuvastatin  20 mg Oral q1800  . senna-docusate  1 tablet Oral QHS  . topiramate  25 mg Oral QHS   Continuous Infusions: . [START ON 05/04/2019] cefTRIAXone (ROCEPHIN)  IV    . heparin 1,400 Units/hr (05/03/19 1211)  . meropenem (MERREM) IV 1 g (05/03/19 0858)   PRN Meds: acetaminophen **OR** acetaminophen, gabapentin, ipratropium-albuterol  Allergies:    Allergies  Allergen Reactions  . Penicillins Other (See Comments)    Per MAR Did it involve swelling of the face/tongue/throat, SOB, or low BP? Unknown Did it involve sudden or severe rash/hives, skin peeling, or any reaction on the inside of your mouth or nose? Unknown Did you need to seek medical attention at a hospital or doctor's office? Unknown When did it last happen? Unk If all above answers are "NO", may proceed with cephalosporin use.     Social History:   Social History   Socioeconomic History  . Marital status: Single    Spouse name: Not on file  . Number of children: Not on file  . Years of education: Not  on file  . Highest education level: Not on file  Occupational History  . Not on file  Social Needs  . Financial resource strain: Not very hard  . Food insecurity    Worry: Patient refused    Inability: Patient refused  . Transportation needs    Medical: Patient refused    Non-medical: Patient refused  Tobacco Use  . Smoking status: Former Games developermoker  . Smokeless tobacco: Never Used  Substance and Sexual Activity  . Alcohol use: Not Currently  . Drug use: Never  . Sexual activity: Not Currently  Lifestyle  . Physical activity    Days per week: Patient refused    Minutes per  session: Patient refused  . Stress: Only a little  Relationships  . Social connections    Talks on phone: More than three times a week    Gets together: Not on file    Attends religious service: Not on file    Active member of club or organization: Not on file    Attends meetings of clubs or organizations: Not on file    Relationship status: Not on file  . Intimate partner violence    Fear of current or ex partner: Patient refused    Emotionally abused: Patient refused    Physically abused: Patient refused    Forced sexual activity: Patient refused  Other Topics Concern  . Not on file  Social History Narrative  . Not on file    Family History:   Family History  Problem Relation Age of Onset  . Heart attack Mother   . High blood pressure Mother   . Heart attack Father   . High blood pressure Father   . Heart attack Brother      ROS:  Please see the history of present illness.  All other ROS reviewed and negative.     Physical Exam/Data:   Vitals:   05/03/19 0100 05/03/19 0157 05/03/19 0749 05/03/19 1140  BP:  (!) 146/60 (!) 137/53 (!) 152/63  Pulse:  85 81 78  Resp:  16 13 16   Temp:  98.3 F (36.8 C) 99.4 F (37.4 C) 99.4 F (37.4 C)  TempSrc:  Oral Oral Oral  SpO2:  98% 98% 100%  Weight: 79.4 kg       Intake/Output Summary (Last 24 hours) at 05/03/2019 1539 Last data filed at 05/03/2019 1445 Gross per 24 hour  Intake 137.53 ml  Output 450 ml  Net -312.47 ml   Last 3 Weights 05/03/2019  Weight (lbs) 175 lb  Weight (kg) 79.379 kg     There is no height or weight on file to calculate BMI.  General:  Well nourished, well developed, in no acute distress HEENT: normal Neck: no JVD Vascular:  Difficulty locating pedal pulses bilaterally, diminished capillary refill Cardiac:  normal S1, S2; RRR; no murmur  Lungs:  clear to auscultation bilaterally, no wheezing, rhonchi or rales  Abd: soft, nontender, no hepatomegaly  Ext: no edema, bilateral venous  insufficiency Musculoskeletal:  No deformities, BUE and BLE strength normal and equal Skin: warm and dry   Neuro:  CNs 2-12 intact, no focal abnormalities noted Psych:  Normal affect   EKG:  The EKG was personally reviewed and demonstrates:   EKG 6/25 T wave Inversion 1 and avL.  EKG 6/26: NSR, HR 79, q wave inferior leads, ST depression V3-V4 Telemetry:  Telemetry was personally reviewed and demonstrates:  NSR, HR 80s, with no arrhythmias noted  Relevant CV Studies: Echo  pending  Laboratory Data:  High Sensitivity Troponin:   Recent Labs  Lab 05/02/19 2332 05/03/19 0615  TROPONINIHS 755* 609*     Cardiac EnzymesNo results for input(s): TROPONINI in the last 168 hours. No results for input(s): TROPIPOC in the last 168 hours.  Chemistry Recent Labs  Lab 05/02/19 2332 05/03/19 1155  NA 133* 134*  K 4.1 4.3  CL 102 105  CO2 21* 20*  GLUCOSE 165* 179*  BUN 30* 29*  CREATININE 1.61* 1.48*  CALCIUM 9.2 9.1  GFRNONAA 32* 35*  GFRAA 37* 41*  ANIONGAP 10 9    Recent Labs  Lab 05/02/19 2332  PROT 6.6  ALBUMIN 2.9*  AST 24  ALT 28  ALKPHOS 79  BILITOT 0.6   Hematology Recent Labs  Lab 05/02/19 2332 05/03/19 1155  WBC 16.1* 16.5*  RBC 3.30* 3.04*  HGB 10.1* 9.3*  HCT 30.7* 28.2*  MCV 93.0 92.8  MCH 30.6 30.6  MCHC 32.9 33.0  RDW 12.3 12.2  PLT 287 277   BNP:2310  DDimer   Radiology/Studies:  No results found.  Assessment and Plan:   1. NSTEMI -patient presented from [place] to [facility] for worsening sob. Patient denied Chest pain at the time.COVID negative. CT negative for PE - prior to admission patient was being treated for PNA with clindamycin   - Elevated troponin and EKG with Tw[place] inversion prompted [facility] -started on heparin drip.  - had previously been on plavix and ASA due to PVD with recent angioplasty.  - Troponin decrease 755>>609 - will discuss with MD for possible cath  2. Heart Failure  - unsure of severity, awaiting records -no signs of edema - pending echo  3. HTN: - was previously on lisinopril 40 mg BID, was discontinued upon admission - B/P elevated on admission 137/53    For questions or updates, please contact [place] Please consult [email or web address] for contact info under     Signed, Cadence Ninfa Meeker, PA-C  05/03/2019 3:39 PM   Personally seen and examined. Agree with above.   73 year old with history of peripheral vascular disease with angioplasty to the right leg, COPD, hypertension, multiple UTIs with type 2 diabetes who was transferred from [facility] residing at [place] rehab facility after she noted some shortness of breath weakness and was treated for pneumonia.  Did not have any complaints of chest discomfort.  Leukocytosis was noted and CT suggested pyelonephritis.  Her EKG did show signs of ischemia with T wave inversion in lateral leads and therefore a troponin was checked and was elevated at 755 which then decreased to 609.  High-sensitivity troponin.  She was therefore started on a heparin drip aspirin bisoprolol and Plavix.  Currently she is laying comfortably in bed states that she is feeling better and that she has not had any chest discomfort.  Echocardiogram has been performed which shows normal ejection fraction.  65%.  GEN: Elderly appearing, somewhat disheveled well nourished, well developed, in no acute distress  HEENT: normal  Neck: no JVD, carotid bruits, or masses Cardiac: RRR; no murmurs, rubs, or gallops,no edema  Respiratory:  clear to auscultation bilaterally, normal work of breathing GI: soft, nontender, nondistended, + BS MS: no deformity or atrophy  Skin: warm and dry, no rash Neuro:  Alert and Oriented x 3, Strength and sensation are intact Psych: euthymic mood, full affect  EKG as described above with T wave inversion in 1 and aVL, telemetry personally reviewed  shows normal sinus rhythm.   Echocardiogram-ejection fraction 65%  Assessment and plan:  Non-ST elevation myocardial infarction -Since she is not having any chest discomfort or anginal type symptoms, I would continue with medical management especially in the setting of her possible pyelonephritis currently on IV meropenem.  Perhaps her shortness of breath over the last few days was her anginal symptom but she is clearly feeling better now and her echocardiogram thankfully shows a normal ejection fraction of 65%.  She does have known peripheral vascular disease and this does heighten her possibility of underlying coronary artery disease however given her current clinical status, I would continue with medical management.  If she does begin to have worsening typical anginal type symptoms especially when she is able to ambulate more consistently, we can always consider diagnostic catheterization in the future. -For now continue with a total of IV heparin for 48 hours. -I would also continue her on Crestor 20 mg, high intensity statin. - Continue with Plavix, aspirin  Peripheral vascular disease -Plavix aspirin.  Family history of CAD -Son died of myocardial infarction at age 73.  Former smoker - Quit in 1984.  Questionable pyelonephritis - Antibiotics may be changed to ceftriaxone tomorrow.  We will check on her tomorrow.  Donato SchultzMark Elynor Kallenberger, MD

## 2019-05-03 NOTE — Progress Notes (Signed)
Patient arrived to unit at [place] on 6/25 from [facility].  Patient stable upon admission.  Patient care team, Internal Medicine, notified that patient is present on unit and requires new admission evaluation.  Orders received, rapid COVID testing at bedside. Informed by Charge Nurse Estill Bamberg RN, critical troponin reported from lab, EKG obtained. Internal medicine notified.  Covid test negative, orders provided to transfer patient off unit. Pharmacist evaluate current orders for Heparin drip currently infusing at 8ml/hr.  Vitals stable, on room air saturation 98-100%.  Patient alert and oriented x 4 and denies pain. Report giving provided to [facility] RN for transport and care to room 2W-15.  Stable upon transfer.

## 2019-05-03 NOTE — Progress Notes (Signed)
CRITICAL VALUE ALERT  Critical Value:  Troponin 755  Date & Time Notied:  05/03/2019 0040  Provider Notified: Sharon Seller  Orders Received/Actions taken: orders received

## 2019-05-03 NOTE — Progress Notes (Signed)
ANTICOAGULATION CONSULT NOTE - [place] for heparin Indication: NSTEMI  Allergies  Allergen Reactions  . Penicillins Other (See Comments)    Per MAR Did it involve swelling of the face/tongue/throat, SOB, or low BP? Unknown Did it involve sudden or severe rash/hives, skin peeling, or any reaction on the inside of your mouth or nose? Unknown Did you need to seek medical attention at a hospital or doctor's office? Unknown When did it last happen? Unk If all above answers are "NO", may proceed with cephalosporin use.     Patient Measurements: Weight: 175 lb (79.4 kg)  Vital Signs: Temp: 99.7 F (37.6 C) (06/26 2035) Temp Source: Tympanic (06/26 2035) BP: 157/65 (06/26 2035) Pulse Rate: 83 (06/26 2035)  Labs: Recent Labs    05/02/19 2332 05/03/19 1000 05/03/19 1155 05/03/19 2123  HGB 10.1*  --  9.3*  --   HCT 30.7*  --  28.2*  --   PLT 287  --  277  --   HEPARINUNFRC <0.10* <0.10*  --  0.17*  CREATININE 1.61*  --  1.48*  --     Assessment: 72 yoF started on IV heparin for ACS r/o at OSH and transferred to [facility]. CT negative for PE, D-dimer elevated, HsT elevated. Heparin level remains low, but increasing  HL 0.17  Goal of Therapy:  Heparin level 0.3-0.7 units/ml Monitor platelets by anticoagulation protocol: Yes   Plan:  -Heparin 1000 units x1 bolus -Increase to 1500 units/h -Daily hep lvl -F/U DC at 48 hrs?  Levester Fresh, PharmD, BCPS, BCCCP Clinical Pharmacist [phone]  Please check AMION for all [place] numbers  05/03/2019 9:56 PM

## 2019-05-03 NOTE — Progress Notes (Signed)
ANTICOAGULATION CONSULT NOTE - [place] for heparin Indication: NSTEMI  Allergies  Allergen Reactions  . Penicillins     Unknown allergy type    Patient Measurements: Weight: 175 lb (79.4 kg)  Vital Signs: Temp: 99.4 F (37.4 C) (06/26 0749) Temp Source: Oral (06/26 0749) BP: 137/53 (06/26 0749) Pulse Rate: 81 (06/26 0749)  Labs: Recent Labs    05/02/19 2332 05/03/19 1000  HGB 10.1*  --   HCT 30.7*  --   PLT 287  --   HEPARINUNFRC <0.10* <0.10*  CREATININE 1.61*  --     Medical History: Past Medical History:  Diagnosis Date  . COPD (chronic obstructive pulmonary disease) ([place])   . Depression   . Diabetes mellitus without complication ([place])   . HTN (hypertension)     Assessment: 73 yoF started on IV heparin for ACS r/o at OSH and transferred to [facility]. CT negative for PE, D-dimer elevated, HsT elevated. Heparin level remains undetectable (drawn ~1hr early), no issues with infusion per nursing.  Goal of Therapy:  Heparin level 0.3-0.7 units/ml Monitor platelets by anticoagulation protocol: Yes   Plan:  -Heparin 2000 units x1 then increase to 1400 units/h -Recheck heparin level in [place], PharmD, BCPS Clinical Pharmacist [phone] Please check AMION for all [place] numbers 05/03/2019

## 2019-05-03 NOTE — Progress Notes (Signed)
Called IMTS and notified them about some new confusion. Patient is disoriented to time, place, and situation. She can be reoriented. She starts to ramble on and asks questions already answered. Displayed some forgetfulness.

## 2019-05-03 NOTE — Progress Notes (Addendum)
ANTICOAGULATION AND ANTIBIOTIC CONSULT NOTE - Initial Consult  Pharmacy Consult for heparin and merropenem Indication: NSTEMI and r/o pyelonephritis  Allergies  Allergen Reactions  . Penicillins     Unknown allergy type    Patient Measurements: Weight: 175 lb (79.4 kg)  Vital Signs: Temp: 98.6 F (37 C) (06/25 1841) Temp Source: Axillary (06/25 1841) BP: 144/57 (06/25 1841) Pulse Rate: 77 (06/25 1841)  Labs: Recent Labs    05/02/19 2332  HGB 10.1*  HCT 30.7*  PLT 287  CREATININE 1.61*    Medical History: Past Medical History:  Diagnosis Date  . COPD (chronic obstructive pulmonary disease) ([place])   . Depression   . Diabetes mellitus without complication ([place])   . HTN (hypertension)     Assessment: 73yo female presents to [facility] w/ SOB; had been tx'd as outpatient w/ doxycycline and clindamycin for several days for presumed PNA without resolution, lives in a facility with a Covid-19 outbreak though test on 6/23 was negative, test at [facility] was negative; D-dimer found to be elevated but CT negative for PE, troponin found to be elevated, started on heparin for NSTEMI and transferred to [facility] for further w/u; also w/ abnormal UA and leukocytosis, started on ABX for possible pyelo.  Records that arrived from OSH are incomplete.  Labs from OSH: WBC 15.8, Hgb 9.9, Plt 289, SCr 1.8, INR 1.1, D-dimer, 1.145, trop 1.74.  Goal of Therapy:  Heparin level 0.3-0.7 units/ml Monitor platelets by anticoagulation protocol: Yes   Plan:  OSH started heparin gtt at 800 units/hr but unclear whether pt received a bolus; will continue for now and monitor heparin levels and CBC.  Pt rec'd cefepime 2g at OSH; will change to merropenem 1g IV Q12H and monitor CBC and Cx.  Wynona Neat, PharmD, BCPS  05/03/2019,1:53 AM   Addendum: Heparin level undetectable.  Will give heparin bolus of 2000 units and increase heparin gtt by 4 units/kg/hr to 1100 units/hr and check level in 8  hours. VB 3:43 AM

## 2019-05-03 NOTE — Progress Notes (Signed)
   Subjective: Patient was seen and evaluated at bedside on morning rounds. No acute events overnight. Complained of right shoulder pain that started last week. Has some shortness of breath but reports that it has improved. She denies chest pain.  Objective:  Vital signs in last 24 hours: Vitals:   05/02/19 2020 05/03/19 0100 05/03/19 0157 05/03/19 0749  BP:   (!) 146/60 (!) 137/53  Pulse:   85 81  Resp:   16 13  Temp:   98.3 F (36.8 C) 99.4 F (37.4 C)  TempSrc:   Oral Oral  SpO2: 96%  98% 98%  Weight:  79.4 kg     Physical exam:  General: Pleasant lady, lying in the bed in no acute distress CV: RRR, normal S1-S2, no murmur, no edema Pulmonary/chest: Normal work of breathing CTA bilaterally, no wheezing, no crackle Abdomen: Soft, nontender to palpation, BS present Allergic exam: Alert and oriented x3, no focal deficit Musculoskeletal: Tenderness at right shoulder, range of motion is normal but causes mild pain.  No erythema Psychiatric exam: Slightly anxious, continuously grabs and hold her IV line  Assessment/Plan:  Principal Problem:   NSTEMI (non-ST elevated myocardial infarction) ([place]) Active Problems:   COPD (chronic obstructive pulmonary disease) (HCC)   Depression   Diabetes mellitus without complication (HCC)   HTN (hypertension)   Acute on chronic respiratory failure with hypoxia (HCC)   Leukocytosis   Acute hypoxemic respiratory failure (HCC)   Pressure injury of skin  73 yo female living at [facility] with CHF (unclear diastolic vs. Systolic), right knee arthritis, UTIs, HTN, COPD, PVD with recent right leg angioplasty, and TIIDM and history of PUD admitted to [facility] earlier today for redness of breath and weakness and leukocytosis and transferred to [facility] for NSTEMI.   NSTEMI CHF Asymptomatic (or atypical considering SOB) with st elevation and T wave changes on EKG and initial troponin elevation 1.35>1.74. Has been on IV heparin(  started at [facility])  Shortness of breath proved but still present. Denies any chest pain.  But reports right shoulder pain that is started last week.  Has local tenderness at that shoulder seems to be MSK pain. Troponin initially increased but now trended down. (High sensitivity Trop: 755-->609)  -Continue IV heparin -Cardiology consulted. Appreciate recommendation -Echo is pending -Continue aspirin, Plavix, Crestor bisoprolol -Continue cardiac monitoring -Fu lipid panel -Repeat EKG  Leukocytosis at 16 Acute Kidney Injury   Imaging: Possible pyelonephritis with hydronephrosis 2/2 recent obstruction on. CTAsymptomatic. Afebrile. AKI with Cr 1.6 on arrival-->1.4 now. Likely post renal She also has two LLE ulcers 1.5x1.5cm that do not appear infected.   Denies urinary symptoms. Repeat UA with rare bacteria and Glucose>500 Started with Meropenem for pyelonephritis with possible obstruction. Will switch to Ceftriaxone tomorrow.  - f/u Ur Na, Ur Cr - CBC, CMP daily - f/u blood cultures, urine culture     Hypertension: BP ranging 130s-140s/60-70s - cont. Home Bisoprolol 2.5 mg qd  - hold hydralazine - hold lisinopril with CKD  PVD With Recent Right Angioplasty -Continue dual anti plt therapy  Type II DM:  On  xultophy 16U QD at home? BG ranges 150-180 today -Ct with CBG monitoring - Continue with sensitive SSI sensitive -Continue Novolog 3U tid  Diet: NPO  IV fluid: None VTE ppx: IV Heparin Code status: Full  Dispo: Anticipated discharge in approximately 2-3 days   Dewayne Hatch, MD 05/03/2019, 10:47 AM Pager: @[email or web address] @

## 2019-05-04 LAB — HEPARIN LEVEL (UNFRACTIONATED)
Heparin Unfractionated: 0.26 IU/mL — ABNORMAL LOW (ref 0.30–0.70)
Heparin Unfractionated: 0.57 IU/mL (ref 0.30–0.70)
Heparin Unfractionated: 0.25 IU/mL — ABNORMAL LOW (ref 0.30–0.70)

## 2019-05-04 LAB — GLUCOSE, CAPILLARY
Glucose-Capillary: 179 mg/dL — ABNORMAL HIGH (ref 70–99)
Glucose-Capillary: 236 mg/dL — ABNORMAL HIGH (ref 70–99)
Glucose-Capillary: 205 mg/dL — ABNORMAL HIGH (ref 70–99)
Glucose-Capillary: 157 mg/dL — ABNORMAL HIGH (ref 70–99)

## 2019-05-04 LAB — BASIC METABOLIC PANEL
Anion gap: 14 (ref 5–15)
BUN: 28 mg/dL — ABNORMAL HIGH (ref 8–23)
CO2: 19 mmol/L — ABNORMAL LOW (ref 22–32)
Calcium: 9 mg/dL (ref 8.9–10.3)
Chloride: 102 mmol/L (ref 98–111)
Creatinine, Ser: 1.42 mg/dL — ABNORMAL HIGH (ref 0.44–1.00)
GFR calc Af Amer: 43 mL/min — ABNORMAL LOW (ref >60)
GFR calc non Af Amer: 37 mL/min — ABNORMAL LOW (ref >60)
Glucose, Bld: 199 mg/dL — ABNORMAL HIGH (ref 70–99)
Potassium: 4.7 mmol/L (ref 3.5–5.1)
Sodium: 135 mmol/L (ref 135–145)

## 2019-05-04 LAB — CBC
HCT: 29.9 % — ABNORMAL LOW (ref 36.0–46.0)
Hemoglobin: 9.9 g/dL — ABNORMAL LOW (ref 12.0–15.0)
MCH: 30.4 pg (ref 26.0–34.0)
MCHC: 33.1 g/dL (ref 30.0–36.0)
MCV: 91.7 fL (ref 80.0–100.0)
Platelets: 289 10*3/uL (ref 150–400)
RBC: 3.26 MIL/uL — ABNORMAL LOW (ref 3.87–5.11)
RDW: 12.4 % (ref 11.5–15.5)
WBC: 17.6 10*3/uL — ABNORMAL HIGH (ref 4.0–10.5)
nRBC: 0 % (ref 0.0–0.2)

## 2019-05-04 LAB — URINE CULTURE: Culture: NO GROWTH

## 2019-05-04 NOTE — Progress Notes (Signed)
Progress Note  Patient Name: Emila Steinhauser Date of Encounter: 05/04/2019  Primary Cardiologist: new  Subjective   No chest pain or SOB  Inpatient Medications    Scheduled Meds: . aspirin EC  81 mg Oral Daily  . bisoprolol  2.5 mg Oral Daily  . clopidogrel  75 mg Oral Daily  . famotidine  10 mg Oral Daily  . insulin aspart  0-5 Units Subcutaneous QHS  . insulin aspart  0-9 Units Subcutaneous TID WC  . insulin aspart  3 Units Subcutaneous TID WC  . polyethylene glycol  17 g Oral Daily  . rosuvastatin  20 mg Oral q1800  . senna-docusate  1 tablet Oral QHS  . topiramate  25 mg Oral QHS   Continuous Infusions: . cefTRIAXone (ROCEPHIN)  IV 1 g (05/04/19 1008)  . heparin 1,600 Units/hr (05/04/19 0641)   PRN Meds: acetaminophen **OR** acetaminophen, gabapentin, ipratropium-albuterol   Vital Signs    Vitals:   05/03/19 2035 05/03/19 2310 05/04/19 0243 05/04/19 0729  BP: (!) 157/65 (!) 156/57 (!) 168/77 (!) 173/73  Pulse: 83 78 84   Resp: 20 19 17 17   Temp: 99.7 F (37.6 C) 99.2 F (37.3 C)  99 F (37.2 C)  TempSrc: Oral Oral  Oral  SpO2: 96% 96% 93%   Weight:        Intake/Output Summary (Last 24 hours) at 05/04/2019 1139 Last data filed at 05/04/2019 0245 Gross per 24 hour  Intake 292 ml  Output 950 ml  Net -658 ml   Last 3 Weights 05/03/2019  Weight (lbs) 175 lb  Weight (kg) 79.379 kg      Telemetry    SR- Personally Reviewed  ECG    n/a- Personally Reviewed  Physical Exam   GEN: No acute distress.   Neck: No JVD Cardiac: RRR, no murmurs, rubs, or gallops.  Respiratory: Clear to auscultation bilaterally. GI: Soft, nontender, non-distended  MS: No edema; No deformity. Neuro:  Nonfocal  Psych: Normal affect   Labs    High Sensitivity Troponin:   Recent Labs  Lab 05/02/19 2332 05/03/19 0615  TROPONINIHS 755* 609*      Cardiac EnzymesNo results for input(s): TROPONINI in the last 168 hours. No results for input(s): TROPIPOC in  the last 168 hours.   Chemistry Recent Labs  Lab 05/02/19 2332 05/03/19 1155 05/04/19 0753  NA 133* 134* 135  K 4.1 4.3 4.7  CL 102 105 102  CO2 21* 20* 19*  GLUCOSE 165* 179* 199*  BUN 30* 29* 28*  CREATININE 1.61* 1.48* 1.42*  CALCIUM 9.2 9.1 9.0  PROT 6.6  --   --   ALBUMIN 2.9*  --   --   AST 24  --   --   ALT 28  --   --   ALKPHOS 79  --   --   BILITOT 0.6  --   --   GFRNONAA 32* 35* 37*  GFRAA 37* 41* 43*  ANIONGAP 10 9 14      Hematology Recent Labs  Lab 05/02/19 2332 05/03/19 1155 05/04/19 0532  WBC 16.1* 16.5* 17.6*  RBC 3.30* 3.04* 3.26*  HGB 10.1* 9.3* 9.9*  HCT 30.7* 28.2* 29.9*  MCV 93.0 92.8 91.7  MCH 30.6 30.6 30.4  MCHC 32.9 33.0 33.1  RDW 12.3 12.2 12.4  PLT 287 277 289    BNPNo results for input(s): BNP, PROBNP in the last 168 hours.   DDimer No results for input(s): DDIMER in the  last 168 hours.   Radiology    No results found.  Cardiac Studies     Patient Profile     Algis Downsoni Renee Long Dattilio is a 73 y.o. female with a hx of CHF, UTIs, arthritis, HTN, COPD, PVD right leg with recent angioplasty on Plavix , Type 2 DM who is being seen today for the evaluation of elevated troponin at the request of Dr. Heide SparkNarendra  Assessment & Plan    1. NSTEMI -patient presented from [facility] to [facility] for worsening sob. Patient denied Chest pain at the time.COVID negative. CT negative for PE - Troponin decrease 755>>609. Prior to transfer peak 1.7. EKG inferior Qwaves, lateral TWIs no old EKGs to compare - echo LVEF 60-65%, grade I diastolic dysfunction  - continue to medical therapy, would consider possible cath pending progress from possible pyelonephritis and also recovery of her AKI. Potential cath early next week - continue medical therapy with ASA, bisoprolol, plavix 75, asa 81, hep gtt, crestor 20. No ACE/ARB due to renal function  2. Possible pyelonephritis - concern for pyelo with possible obstruction, AKI   3. PAD - history of prior angioplasty  4. Pneumonia - being [facility] prior to transfer     For questions or updates, please contact [facility] Please consult [email or web address] for contact info under        Signed, Dina RichBranch, Tabita Corbo, MD  05/04/2019, 11:39 AM

## 2019-05-04 NOTE — Progress Notes (Signed)
ANTICOAGULATION CONSULT NOTE - Follow Up Consult  Pharmacy Consult for heparin Indication: NSTEMI  Labs: Recent Labs    05/02/19 2332 05/03/19 0615 05/03/19 1000 05/03/19 1155 05/03/19 2123 05/04/19 0532  HGB 10.1*  --   --  9.3*  --  9.9*  HCT 30.7*  --   --  28.2*  --  29.9*  PLT 287  --   --  277  --  289  HEPARINUNFRC <0.10*  --  <0.10*  --  0.17* 0.26*  CREATININE 1.61*  --   --  1.48*  --   --   TROPONINIHS 755* 609*  --   --   --   --     Assessment: 73yo female remains subtherapeutic on heparin after rate changes though now close to goal; no gtt issues or signs of bleeding per RN.  Goal of Therapy:  Heparin level 0.3-0.7 units/ml   Plan:  Will increase heparin gtt slightly to 1600 units/hr and check level in 8 hours.    Wynona Neat, PharmD, BCPS  05/04/2019,6:34 AM

## 2019-05-04 NOTE — Progress Notes (Signed)
ANTICOAGULATION CONSULT NOTE - [place] for heparin Indication: NSTEMI  Allergies  Allergen Reactions  . Penicillins Other (See Comments)    Per MAR Did it involve swelling of the face/tongue/throat, SOB, or low BP? Unknown Did it involve sudden or severe rash/hives, skin peeling, or any reaction on the inside of your mouth or nose? Unknown Did you need to seek medical attention at a hospital or doctor's office? Unknown When did it last happen? Unk If all above answers are "NO", may proceed with cephalosporin use.    Patient Measurements: Weight: 175 lb (79.4 kg)  Vital Signs: Temp: 98.8 F (37.1 C) (06/27 1925) Temp Source: Oral (06/27 1925) BP: 151/61 (06/27 1925) Pulse Rate: 73 (06/27 2000)  Labs: Recent Labs    05/02/19 2332  05/03/19 1155  05/04/19 0532 05/04/19 0753 05/04/19 1335 05/04/19 2204  HGB 10.1*  --  9.3*  --  9.9*  --   --   --   HCT 30.7*  --  28.2*  --  29.9*  --   --   --   PLT 287  --  277  --  289  --   --   --   HEPARINUNFRC <0.10*   < >  --    < > 0.26*  --  0.57 0.25*  CREATININE 1.61*  --  1.48*  --   --  1.42*  --   --    < > = values in this interval not displayed.   Assessment: 53 yoF from facility started on IV heparin for ACS r/o at [facility] and transferred to [facility]. CT negative for PE, D-dimer elevated, HsT elevated.   Heparin level now 0.25 units/ml.  No issues noted with infusion  Goal of Therapy:  Heparin level 0.3-0.7 units/ml Monitor platelets by anticoagulation protocol: Yes   Plan:  Increase Heparin infusion to 1700 units/hr Daily Hep level, daily CBC  Thanks for allowing pharmacy to be a part of this patient's care.  Excell Seltzer, PharmD Clinical Pharmacist

## 2019-05-04 NOTE — Progress Notes (Signed)
ANTICOAGULATION CONSULT NOTE - [place] for heparin Indication: NSTEMI  Allergies  Allergen Reactions  . Penicillins Other (See Comments)    Per MAR Did it involve swelling of the face/tongue/throat, SOB, or low BP? Unknown Did it involve sudden or severe rash/hives, skin peeling, or any reaction on the inside of your mouth or nose? Unknown Did you need to seek medical attention at a hospital or doctor's office? Unknown When did it last happen? Unk If all above answers are "NO", may proceed with cephalosporin use.    Patient Measurements: Weight: 175 lb (79.4 kg)  Vital Signs: Temp: 98.4 F (36.9 C) (06/27 1243) Temp Source: Oral (06/27 0729) BP: 152/115 (06/27 1243) Pulse Rate: 83 (06/27 1243)  Labs: Recent Labs    05/02/19 2332  05/03/19 1155 05/03/19 2123 05/04/19 0532 05/04/19 0753 05/04/19 1335  HGB 10.1*  --  9.3*  --  9.9*  --   --   HCT 30.7*  --  28.2*  --  29.9*  --   --   PLT 287  --  277  --  289  --   --   HEPARINUNFRC <0.10*   < >  --  0.17* 0.26*  --  0.57  CREATININE 1.61*  --  1.48*  --   --  1.42*  --    < > = values in this interval not displayed.   Assessment: 36 yoF from facility started on IV heparin for ACS r/o at [facility] and transferred to [facility]. CT negative for PE, D-dimer elevated, HsT elevated. Heparin level remains low, but increasing.  Today, 05/04/2019 1335 Hep level = 0.57 units/ml on 1600 units/hr Required Heparin boluses 2000, 2000 and 1000 units, with rate increases from 1100 units/hr > 1600 Heparin level finally therapeutic Hgb 9.9 (unchanged from admit), Plt wnl  Goal of Therapy:  Heparin level 0.3-0.7 units/ml Monitor platelets by anticoagulation protocol: Yes   Plan:  Continue Heparin infusion at 1600 units/hr Recheck Heparin level at 2200 Daily Hep level, daily CBC  Minda Ditto PharmD Clinical Pharmacist [phone] Please check AMION for all [place] numbers  05/04/2019  2:25 PM

## 2019-05-04 NOTE — Progress Notes (Signed)
   Subjective: Patient denies nausea, vomiting, chest pain, burning with urination or back pain. States that she is bored in the hospital.   Objective:  Vital signs in last 24 hours: Vitals:   05/03/19 2310 05/04/19 0243 05/04/19 0729 05/04/19 1243  BP: (!) 156/57 (!) 168/77 (!) 173/73 (!) 152/115  Pulse: 78 84  83  Resp: 19 17 17 17   Temp: 99.2 F (37.3 C)  99 F (37.2 C) 98.4 F (36.9 C)  TempSrc: Oral  Oral   SpO2: 96% 93%  97%  Weight:       Ph/E: General: No acute distress, appears comfortable,  CV: RRR, normal S1-S2, murmur Neurology exam: Alert and oriented x3, follows the command Abdomen: Soft, nontender to palpation  Assessment/Plan:  Principal Problem:   NSTEMI (non-ST elevated myocardial infarction) ([place]) Active Problems:   COPD (chronic obstructive pulmonary disease) (HCC)   Depression   Diabetes mellitus without complication (HCC)   HTN (hypertension)   Acute on chronic respiratory failure with hypoxia (HCC)   Leukocytosis   Acute hypoxemic respiratory failure (HCC)   Pressure injury of skin  NSTEMI CHF Asymptomatic (or atypical considering SOB) with st elevation and T wave changes on EKGand initial troponin elevation 1.35>1.74. (High sensitivity Trop: 755-->609) Has been on IV heparin( started at [facility]) Currently asymptomatic.  Is on IV Heparin.  Echo with with an ejection fraction of 60-65%. The cavity size was normal. There is mildly increased left ventricular wall thickness. Left ventricular diastolic Doppler parameters are consistent with impaired relaxation. No evidence of left ventricular regional wall motion abnormalities.  -Cardiology recommended continuing current medical management and probably do cath next week when pylonephritis improves. -Continue IV heparin -Continue aspirin, Plavix, Crestor bisoprolol  -Appreciate cardiology recommendation -Continue cardiac monitoring  Acute Kidney Injury: AKI with Cr 1.6 on arrival-->1.42 now.  Likely continue to dehydration/retention setting of mirtazapine use. Possible pyelonephritison CT scan with hydronephrosis 2/2 recent obstructionon.  Afebrile. clonically stable and asymptomatic. Leukocytosis at 16 still has leukocytosis at 17. - Continue ceftriaxone  - CBC, CMP daily - f/u blood cultures urine culture (negative so far)   Hypertension: Hypertensive SBP 150s-170s Creatinine improving, may consider resuming home lisinopril tomorrow  - Continue home Bisoprolol 2.5 mg qd  - hold hydralazine due to dehydration - hold lisinopril with AKI.  May consider resuming tomorrow improves  PVD With Recent Right Angioplasty -Continue dual antiplatelet therapy  Type II DM: cbg this morning 179-->239-->205   CBG (last 3)  Recent Labs    05/04/19 0730 05/04/19 1225 05/04/19 1616  GLUCAP 179* 236* 205*   On  xultophy 16UQD at home? BG ranges today -Ct with CBG monitoring - Continue with sensitive SSI sensitive -Continue Novolog 3U tid  Dispo: Anticipated discharge in approximately 3-5 day(s).   Dewayne Hatch, MD 05/04/2019, 5:04 PM Pager: Pager: [phone]

## 2019-05-04 NOTE — Progress Notes (Signed)
Internal Medicine Teaching Service Attending:   I saw and examined the patient. I reviewed the resident's note and I agree with the resident's findings and plan as documented in the resident's note.  Principal Problem:   NSTEMI (non-ST elevated myocardial infarction) ([place]) Active Problems:   COPD (chronic obstructive pulmonary disease) (HCC)   Depression   Diabetes mellitus without complication (HCC)   HTN (hypertension)   Acute on chronic respiratory failure with hypoxia (HCC)   Leukocytosis   Acute hypoxemic respiratory failure (HCC)   Pressure injury of skin  Hospital day #3 for this 73 year old person admitted with NSTEMI currently being managed medically with heparin infusion, aspirin, plavix, and rosuvastatin. Greatly appreciate cardiology consultation, planning for catheterization in a few days. Also found to have abnormal CT scan with signs of pyelonephritis and hydronephrosis, but no ureteral stones or other source of obstruction. She is on day three of antibiotic with ceftriaxone, urine culture no growth likely due to early antibiotics given at [facility]. She has no flank pain, no fever, mild leukocytosis. Will continue for planned 7 days of antibiotics.   Lalla Brothers, MD FACP

## 2019-05-05 LAB — HEPARIN LEVEL (UNFRACTIONATED)
Heparin Unfractionated: 0.33 IU/mL (ref 0.30–0.70)
Heparin Unfractionated: 0.1 IU/mL — ABNORMAL LOW (ref 0.30–0.70)

## 2019-05-05 LAB — GLUCOSE, CAPILLARY
Glucose-Capillary: 167 mg/dL — ABNORMAL HIGH (ref 70–99)
Glucose-Capillary: 206 mg/dL — ABNORMAL HIGH (ref 70–99)
Glucose-Capillary: 181 mg/dL — ABNORMAL HIGH (ref 70–99)
Glucose-Capillary: 160 mg/dL — ABNORMAL HIGH (ref 70–99)

## 2019-05-05 LAB — CBC
HCT: 28.6 % — ABNORMAL LOW (ref 36.0–46.0)
Hemoglobin: 9.2 g/dL — ABNORMAL LOW (ref 12.0–15.0)
MCH: 29.2 pg (ref 26.0–34.0)
MCHC: 32.2 g/dL (ref 30.0–36.0)
MCV: 90.8 fL (ref 80.0–100.0)
Platelets: 336 10*3/uL (ref 150–400)
RBC: 3.15 MIL/uL — ABNORMAL LOW (ref 3.87–5.11)
RDW: 12.2 % (ref 11.5–15.5)
WBC: 15.8 10*3/uL — ABNORMAL HIGH (ref 4.0–10.5)
nRBC: 0 % (ref 0.0–0.2)

## 2019-05-05 LAB — BASIC METABOLIC PANEL
Anion gap: 12 (ref 5–15)
BUN: 28 mg/dL — ABNORMAL HIGH (ref 8–23)
CO2: 20 mmol/L — ABNORMAL LOW (ref 22–32)
Calcium: 8.7 mg/dL — ABNORMAL LOW (ref 8.9–10.3)
Chloride: 103 mmol/L (ref 98–111)
Creatinine, Ser: 1.31 mg/dL — ABNORMAL HIGH (ref 0.44–1.00)
GFR calc Af Amer: 47 mL/min — ABNORMAL LOW (ref >60)
GFR calc non Af Amer: 41 mL/min — ABNORMAL LOW (ref >60)
Glucose, Bld: 175 mg/dL — ABNORMAL HIGH (ref 70–99)
Potassium: 3.9 mmol/L (ref 3.5–5.1)
Sodium: 135 mmol/L (ref 135–145)

## 2019-05-05 MED ORDER — COLLAGENASE 250 UNIT/GM EX OINT
TOPICAL_OINTMENT | Freq: Every day | CUTANEOUS | Status: DC
  Administered 2019-05-05 – 2019-05-15 (×10): via TOPICAL
  Filled 2019-05-05 (×3): qty 30

## 2019-05-05 MED ORDER — HEPARIN BOLUS VIA INFUSION
2000.0000 [IU] | Freq: Once | INTRAVENOUS | Status: AC
  Administered 2019-05-05: 2000 [IU] via INTRAVENOUS
  Filled 2019-05-05: qty 2000

## 2019-05-05 NOTE — Progress Notes (Signed)
   Subjective:   The patient was seen sitting up in bed this morning eating breakfast. She stated that she was doing well. Denied nausea, vomiting, sob, chest pain, abdominal pain. She appeared much more alert and interactive today.   Objective:  Vital signs in last 24 hours: Vitals:   05/05/19 0928 05/05/19 1309 05/05/19 1337 05/05/19 1708  BP: (!) 117/57 (!) 130/52  (!) 136/59  Pulse: 77 74 80 80  Resp: 16 19 15 19   Temp:    100.2 F (37.9 C)  TempSrc:    Oral  SpO2: 97% 93% 95% 98%  Weight:       Physical Exam  Constitutional: She is oriented to person, place, and time. She appears well-developed and well-nourished. No distress.  HENT:  Head: Normocephalic and atraumatic.  Eyes: Conjunctivae are normal.  Cardiovascular: Normal rate, regular rhythm and normal heart sounds.  Respiratory: Effort normal and breath sounds normal. No respiratory distress. She has no wheezes.  GI: Soft. Bowel sounds are normal. She exhibits no distension. There is no abdominal tenderness.  Musculoskeletal:        General: No edema.  Neurological: She is alert and oriented to person, place, and time.  Skin: She is not diaphoretic.  Psychiatric: She has a normal mood and affect. Her behavior is normal. Judgment and thought content normal.   Assessment/Plan:  Principal Problem:   NSTEMI (non-ST elevated myocardial infarction) ([place]) Active Problems:   COPD (chronic obstructive pulmonary disease) (HCC)   Depression   Diabetes mellitus without complication (HCC)   HTN (hypertension)   Acute on chronic respiratory failure with hypoxia (HCC)   Leukocytosis   Acute hypoxemic respiratory failure (HCC)   Pressure injury of skin  NSTEMI CHF The patient presented with elevated troponin 755>609 with ekg showing t wave inversions in the inferior and lateral leads.   Patient is planned to get a lhc on 6/30.   -continue aspirin  -continue bisoprolol 2.5mg  qd  -continue plavix 75mg  qd  -continue  crestor 20mg  qd  -iv heparin discontinued  Possible pyelonephritis Afebrile and leukocytosis improved to 15.8 from 17.6.   -Blood culture 6/26 no growth  -Urine cutlure 6/26 no growth -continue ceftriaxone day 2, de-escalated from meropenem  Acute Kidney Injury Improved to 1.31 from 1.61 on admission.   -hold lisinopril  Hypertension Blood pressure ranging 110-130/50-60s over the past 24 hrs.   - Continue home Bisoprolol 2.5 mg qd  - hold hydralazine due to dehydration - hold lisinopril   Type II DM Glucose ranging 170-200s.   -continue ssitid wc  -continue ssiqhs -continue novolog 3units tid   Dispo: Anticipated discharge in approximately 2-3 day(s).   Lars Mage, MD 05/05/2019, 5:29 PM Pager: [phone]

## 2019-05-05 NOTE — Plan of Care (Signed)
  Problem: Clinical Measurements: Goal: Ability to maintain clinical measurements within normal limits will improve Outcome: Progressing   

## 2019-05-05 NOTE — Consult Note (Signed)
[place] Nurse wound consult note Reason for Consult: left foot wounds  Wound type: Unclear etiology, patient is poor historian. Patient with history of DM. Neuropathy is present with history of PVD as well.  Pressure Injury POA: Yes Measurement: Left achilles: 4cm x 2cm x 0.2cm  Left lateral foot; 0.3cm x 0.2cm x 0.1cm  Left lateral foot: 3cm x 2cm x 0.2cm  Wound bed: Left achilles: 100% yellow soft slough, concerned for possible tendon exposure Left lateral foot distal; 90% clean, pink/10% scattered fibrinous material Left lateral foot proximal; 100% clean, pink Drainage (amount, consistency, odor) minimal, yellow from the achilles. No dressing in place for the left lateral foot, not able to appreciate drainage on the linen Periwound: intact  Dressing procedure/placement/frequency: 1. Add enzymatic debridement ointment to the wounds, cover with moist saline dressing. Change daily.   Follow up with wound care center of her choice, needs follow up due to possibility of tendon exposure on the achilles at some point.   Discussed POC with patient and bedside nurse.  Re consult if needed, will not follow at this time. Thanks  Zuleika Gallus [employer], RN,CWOCN, CNS, [place] [phone])

## 2019-05-05 NOTE — Progress Notes (Signed)
Progress Note  Patient Name: Stephanie [name] Date of Encounter: 05/05/2019  Primary Cardiologist: New  Subjective   No complaints  Inpatient Medications    Scheduled Meds: . aspirin EC  81 mg Oral Daily  . bisoprolol  2.5 mg Oral Daily  . clopidogrel  75 mg Oral Daily  . famotidine  10 mg Oral Daily  . insulin aspart  0-5 Units Subcutaneous QHS  . insulin aspart  0-9 Units Subcutaneous TID WC  . insulin aspart  3 Units Subcutaneous TID WC  . polyethylene glycol  17 g Oral Daily  . rosuvastatin  20 mg Oral q1800  . senna-docusate  1 tablet Oral QHS  . topiramate  25 mg Oral QHS   Continuous Infusions: . cefTRIAXone (ROCEPHIN)  IV 1 g (05/04/19 1008)  . heparin 1,700 Units/hr (05/05/19 0424)   PRN Meds: acetaminophen **OR** acetaminophen, gabapentin, ipratropium-albuterol   Vital Signs    Vitals:   05/05/19 0410 05/05/19 0422 05/05/19 0755 05/05/19 0925  BP: (!) 147/57   (!) 117/57  Pulse: 74 72 73 78  Resp: 14 14 16 13   Temp: 98.6 F (37 C)   98.4 F (36.9 C)  TempSrc: Oral   Oral  SpO2:  94% 95% 99%  Weight:        Intake/Output Summary (Last 24 hours) at 05/05/2019 1109 Last data filed at 05/05/2019 0424 Gross per 24 hour  Intake 275.88 ml  Output 350 ml  Net -74.12 ml   Last 3 Weights 05/03/2019  Weight (lbs) 175 lb  Weight (kg) 79.379 kg      Telemetry    SR, occaiosnal PVCs - Personally Reviewed  ECG    n/a  Physical Exam   GEN: No acute distress.   Neck: No JVD Cardiac: RRR, no murmurs, rubs, or gallops.  Respiratory: Clear to auscultation bilaterally. GI: Soft, nontender, non-distended  MS: No edema; No deformity. Neuro:  Nonfocal  Psych: Normal affect   Labs    High Sensitivity Troponin:   Recent Labs  Lab 05/02/19 2332 05/03/19 0615  TROPONINIHS 755* 609*      Cardiac EnzymesNo results for input(s): TROPONINI in the last 168 hours. No results for input(s): TROPIPOC in the last 168 hours.   Chemistry Recent Labs   Lab 05/02/19 2332 05/03/19 1155 05/04/19 0753 05/05/19 0719  NA 133* 134* 135 135  K 4.1 4.3 4.7 3.9  CL 102 105 102 103  CO2 21* 20* 19* 20*  GLUCOSE 165* 179* 199* 175*  BUN 30* 29* 28* 28*  CREATININE 1.61* 1.48* 1.42* 1.31*  CALCIUM 9.2 9.1 9.0 8.7*  PROT 6.6  --   --   --   ALBUMIN 2.9*  --   --   --   AST 24  --   --   --   ALT 28  --   --   --   ALKPHOS 79  --   --   --   BILITOT 0.6  --   --   --   GFRNONAA 32* 35* 37* 41*  GFRAA 37* 41* 43* 47*  ANIONGAP 10 9 14 12      Hematology Recent Labs  Lab 05/03/19 1155 05/04/19 0532 05/05/19 0719  WBC 16.5* 17.6* 15.8*  RBC 3.04* 3.26* 3.15*  HGB 9.3* 9.9* 9.2*  HCT 28.2* 29.9* 28.6*  MCV 92.8 91.7 90.8  MCH 30.6 30.4 29.2  MCHC 33.0 33.1 32.2  RDW 12.2 12.4 12.2  PLT 277 289 336  BNPNo results for input(s): BNP, PROBNP in the last 168 hours.   DDimer No results for input(s): DDIMER in the last 168 hours.   Radiology    No results found.  Cardiac Studies     Patient Profile     Stephanie Numbersoni Renee Long Wilsonis a 73 y.o.femalewith a hx of CHF, UTIs, arthritis, HTN, COPD, PVD right leg with recent angioplasty on Plavix , Type 2 DMwho is being seen today for the evaluation of elevated troponinat the request of Dr. Heide SparkNarendra  Assessment & Plan    1. NSTEMI -patient presented from [facility] for worsening sob. Patient denied Chest pain at the time.COVID negative. CT negative for PE - Troponin decrease hstrop [id number]. Prior to transfer peak 1.7 by regular troponin. EKG inferior Qwaves, lateral TWIs no old EKGs to compare - echo LVEF 60-65%, grade I diastolic dysfunction  - continue to medical therapy, would consider possible cath pending progress from possible pyelonephritis and also recovery of her AKI. Potential cath early next week - continue medical therapy with ASA, bisoprolol, plavix 75(had been on for her PAD), asa 81, hep gtt, crestor 20. No ACE/ARB due to  renal function  - would anticipate cath likely Tuesday  2. Possible pyelonephritis - concern for pyelo with possible obstruction, AKI - currently on ceftriaxone. Ongoing leukocytosius, renal function improving.   3. PAD - history of prior angioplasty  4. [facility] - [facility]   For questions or updates, please contact [facility] Please consult [email or web address] for contact info under        Signed, Dina RichBranch, Kayelee Herbig, MD  05/05/2019, 11:09 AM

## 2019-05-05 NOTE — Progress Notes (Addendum)
ANTICOAGULATION CONSULT NOTE - [place] for heparin Indication: NSTEMI  Allergies  Allergen Reactions  . Penicillins Other (See Comments)    Per MAR Did it involve swelling of the face/tongue/throat, SOB, or low BP? Unknown Did it involve sudden or severe rash/hives, skin peeling, or any reaction on the inside of your mouth or nose? Unknown Did you need to seek medical attention at a hospital or doctor's office? Unknown When did it last happen? Unk If all above answers are "NO", may proceed with cephalosporin use.    Patient Measurements: Weight: 175 lb (79.4 kg)  Vital Signs: Temp: 98.4 F (36.9 C) (06/28 0925) Temp Source: Oral (06/28 0925) BP: 117/57 (06/28 0925) Pulse Rate: 78 (06/28 0925)  Labs: Recent Labs    05/03/19 1155  05/04/19 0532 05/04/19 0753 05/04/19 1335 05/04/19 2204 05/05/19 0719  HGB 9.3*  --  9.9*  --   --   --  9.2*  HCT 28.2*  --  29.9*  --   --   --  28.6*  PLT 277  --  289  --   --   --  336  HEPARINUNFRC  --    < > 0.26*  --  0.57 0.25* 0.33  CREATININE 1.48*  --   --  1.42*  --   --  1.31*   < > = values in this interval not displayed.   Assessment: 28 yoF from facility started on IV heparin for ACS r/o at [facility] and transferred to [facility]. CT negative for PE, D-dimer elevated, HsT elevated. Troponins elevated x2. Heparin level remains low, but increasing.  Today, 05/05/2019 0719 Hep level = 0.33 units/ml on 1700 units/hr Hgb 9.2, sl decreased, Plt wnl  Goal of Therapy:  Heparin level 0.3-0.7 units/ml Monitor platelets by anticoagulation protocol: Yes   Plan:  Continue Heparin infusion at 1700 units/hr Recheck Heparin level at 4p (confirmatory) Daily Hep level, daily CBC  Addendum 16:50 15:38 confirmatory Hep level = 0.10 Heparin was off for at least 30 min ~ 11am, resumed at 1700 units/hr rebolus with 2000 units, increase infusion to 1900 units/hr Recheck Hep level at 12MN Daily Hep level  rescheduled for 6/30   Minda Ditto PharmD Clinical Pharmacist [phone] Please check AMION for all [place] numbers 05/05/2019 10:26 AM

## 2019-05-06 DIAGNOSIS — E119 Type 2 diabetes mellitus without complications: Secondary | ICD-10-CM

## 2019-05-06 LAB — HEPARIN LEVEL (UNFRACTIONATED)
Heparin Unfractionated: 0.35 [IU]/mL (ref 0.30–0.70)
Heparin Unfractionated: 0.48 IU/mL (ref 0.30–0.70)

## 2019-05-06 LAB — BASIC METABOLIC PANEL WITH GFR
Anion gap: 11 (ref 5–15)
BUN: 31 mg/dL — ABNORMAL HIGH (ref 8–23)
CO2: 22 mmol/L (ref 22–32)
Calcium: 8.5 mg/dL — ABNORMAL LOW (ref 8.9–10.3)
Chloride: 100 mmol/L (ref 98–111)
Creatinine, Ser: 1.54 mg/dL — ABNORMAL HIGH (ref 0.44–1.00)
GFR calc Af Amer: 39 mL/min — ABNORMAL LOW
GFR calc non Af Amer: 33 mL/min — ABNORMAL LOW
Glucose, Bld: 173 mg/dL — ABNORMAL HIGH (ref 70–99)
Potassium: 4 mmol/L (ref 3.5–5.1)
Sodium: 133 mmol/L — ABNORMAL LOW (ref 135–145)

## 2019-05-06 LAB — CBC
HCT: 25.8 % — ABNORMAL LOW (ref 36.0–46.0)
Hemoglobin: 8.5 g/dL — ABNORMAL LOW (ref 12.0–15.0)
MCH: 29.7 pg (ref 26.0–34.0)
MCHC: 32.9 g/dL (ref 30.0–36.0)
MCV: 90.2 fL (ref 80.0–100.0)
Platelets: 321 K/uL (ref 150–400)
RBC: 2.86 MIL/uL — ABNORMAL LOW (ref 3.87–5.11)
RDW: 12.2 % (ref 11.5–15.5)
WBC: 15.1 K/uL — ABNORMAL HIGH (ref 4.0–10.5)
nRBC: 0 % (ref 0.0–0.2)

## 2019-05-06 LAB — GLUCOSE, CAPILLARY
Glucose-Capillary: 204 mg/dL — ABNORMAL HIGH (ref 70–99)
Glucose-Capillary: 214 mg/dL — ABNORMAL HIGH (ref 70–99)
Glucose-Capillary: 211 mg/dL — ABNORMAL HIGH (ref 70–99)
Glucose-Capillary: 168 mg/dL — ABNORMAL HIGH (ref 70–99)

## 2019-05-06 MED ORDER — SODIUM CHLORIDE 0.9 % IV SOLN: 250.0000 mL | INTRAVENOUS | Status: DC | PRN

## 2019-05-06 MED ORDER — SODIUM CHLORIDE 0.9% FLUSH: 3.0000 mL | INTRAVENOUS | Status: DC | PRN

## 2019-05-06 MED ORDER — SODIUM CHLORIDE 0.9 % WEIGHT BASED INFUSION: 3.0000 mL/kg/h | INTRAVENOUS | Status: DC

## 2019-05-06 MED ORDER — SODIUM CHLORIDE 0.9 % WEIGHT BASED INFUSION
1.0000 mL/kg/h | INTRAVENOUS | Status: DC
  Administered 2019-05-07: 1 mL/kg/h via INTRAVENOUS

## 2019-05-06 MED ORDER — ASPIRIN 81 MG PO CHEW
81.0000 mg | CHEWABLE_TABLET | ORAL | Status: AC
  Administered 2019-05-07: 81 mg via ORAL
  Filled 2019-05-06: qty 1

## 2019-05-06 MED ORDER — SODIUM CHLORIDE 0.9 % WEIGHT BASED INFUSION: 1.0000 mL/kg/h | INTRAVENOUS | Status: DC

## 2019-05-06 MED ORDER — SODIUM CHLORIDE 0.9% FLUSH
3.0000 mL | Freq: Two times a day (BID) | INTRAVENOUS | Status: DC
  Administered 2019-05-06: 3 mL via INTRAVENOUS

## 2019-05-06 MED ORDER — ASPIRIN 81 MG PO CHEW: 81.0000 mg | CHEWABLE_TABLET | ORAL | Status: DC

## 2019-05-06 NOTE — Progress Notes (Signed)
ANTICOAGULATION CONSULT NOTE - Follow Up Consult  Pharmacy Consult for Heparin Indication: NSTEMI  Allergies  Allergen Reactions  . Penicillins Other (See Comments)    Per MAR Did it involve swelling of the face/tongue/throat, SOB, or low BP? Unknown Did it involve sudden or severe rash/hives, skin peeling, or any reaction on the inside of your mouth or nose? Unknown Did you need to seek medical attention at a hospital or doctor's office? Unknown When did it last happen? Unk If all above answers are "NO", may proceed with cephalosporin use.     Patient Measurements: Weight: 175 lb (79.4 kg) Heparin Dosing Weight: 79.4 kg  Vital Signs: Temp: 98.6 F (37 C) (06/29 0759) Temp Source: Oral (06/29 0759) BP: 147/65 (06/29 0759) Pulse Rate: 73 (06/29 0759)  Labs: Recent Labs    05/04/19 0532 05/04/19 0753  05/05/19 0719 05/05/19 1538 05/06/19 0008 05/06/19 0823  HGB 9.9*  --   --  9.2*  --  8.5*  --   HCT 29.9*  --   --  28.6*  --  25.8*  --   PLT 289  --   --  336  --  321  --   HEPARINUNFRC 0.26*  --    < > 0.33 0.10* 0.35 0.48  CREATININE  --  1.42*  --  1.31*  --  1.54*  --    < > = values in this interval not displayed.    CrCl cannot be calculated (Unknown ideal weight.).   Assessment:  Anticoag: Hep for ACS w/u. Planned Heparin x 48 hr per Cards, with Plavix/ASA, statin, but continuing Heparin infusion per Cards, prob cath Tuesday. Heparin level 0.35 confirmed in goal range at 0.48 this AM. Hgb 9.2>8.5. Plts 321 stable.  Goal of Therapy:  Heparin level 0.3-0.7 units/ml Monitor platelets by anticoagulation protocol: Yes   Plan:  -Continue IV heparin at 1900 units/hr Daily HL and CBC Cath 6/30. Need to resume home Allegra, Flonase, Gabapentin scheduled, Paxil, Lantus, Probiotic?  Joelie Schou S. Alford Highland, PharmD, BCPS Clinical Staff Pharmacist Eilene Ghazi Stillinger 05/06/2019,9:53 AM

## 2019-05-06 NOTE — Progress Notes (Signed)
   Subjective:  The patient was seen sitting up eating her breakfast. She has no complaint. She denied any dysuria, flank pain. Has finished her breakfast. Overnight the patient had a fever of 100.6.    Objective:  Vital signs in last 24 hours: Vitals:   05/05/19 1337 05/05/19 1708 05/05/19 2005 05/05/19 2300  BP:  (!) 136/59 (!) 162/68 (!) 152/65  Pulse: 80 80 79 82  Resp: 15 19 18  (!) 21  Temp:  100.2 F (37.9 C) 99.6 F (37.6 C) (!) 100.6 F (38.1 C)  TempSrc:  Oral Oral Oral  SpO2: 95% 98% 97% 97%  Weight:       Physical Exam  Constitutional: She is oriented to person, place, and time. She appears well-developed and well-nourished. No distress.  Staring off into space  HENT:  Head: Normocephalic and atraumatic.  Eyes: Conjunctivae are normal.  Cardiovascular: Normal rate, regular rhythm and normal heart sounds.  Abdominal: Soft. Bowel sounds are normal. She exhibits no distension. There is no abdominal tenderness.  Musculoskeletal:        General: No edema.  Neurological: She is alert and oriented to person, place, and time.  Skin: She is not diaphoretic.   Assessment/Plan:  Principal Problem:   NSTEMI (non-ST elevated myocardial infarction) ([place]) Active Problems:   COPD (chronic obstructive pulmonary disease) (HCC)   Depression   Diabetes mellitus without complication (HCC)   HTN (hypertension)   Acute on chronic respiratory failure with hypoxia (HCC)   Leukocytosis   Acute hypoxemic respiratory failure (HCC)   Pressure injury of skin  NSTEMI CHF The patient presented with elevated troponin 755>609 with ekg showing t wave inversions in the inferior and lateral leads.  Was on IV heparin that discontinued.  Chest pain free since admission. Cardiology is following. Planned for [facility] cath likely tomorrow (delayed last week due to pyelonephritis)    -NPO after midnight -Heparin discontinued -Continue aspirin, Plavix, Crestor, bisoprolol 2.5 mg daily -N.p.o.  after midnight for possible left heart cath tomorrow   Possible pyelonephritis Afebrile  leukocytosis improving today 15.2 from 25.8 yesterday Blood culture 6/26 no growth  Urine cutlure 6/26 no growth  -Continue Ceftriaxone (started 6/27). de-escalated from meropenem   Acute Kidney Injury Elevated to 1.5 comparing to 1.3 yesterday. -Will monitor and holding lisinopril -BMP daily  Hypertension Blood pressure ranging SBP: 130s-160s morning  -Continue home Bisoprolol 2.5 mg qd  - hold hydralazinedue to dehydration - hold lisinopril   Type II DM Glucose ranging 160s-200 s.   -continue ssi -continue novolog 3units tid  -CBG monitoring  Dispo: Anticipated discharge in approximately 1-2 days  Dewayne Hatch, MD 05/06/2019, 4:22 AM Pager: [phone]

## 2019-05-06 NOTE — Care Management Important Message (Signed)
Important Message  Patient Details  Name: Stephanie [name] MRN: [id number] Date of Birth: [birth year]   Medicare Important Message Given:  Yes     Orbie Pyo 05/06/2019, 1:23 PM

## 2019-05-06 NOTE — H&P (View-Only) (Signed)
Progress Note  Patient Name: Stephanie [name] Date of Encounter: 05/06/2019  Primary Cardiologist: Branch  Subjective   73 yo with hx of CHF, UTIs, HTN, COPD , PAD ,   Presents with recent worsening .  dyspnea,  troponins suggest NSTEMI.   ECG  .  She has diuresed 2.6 liters so far this admission   Inpatient Medications    Scheduled Meds: . aspirin EC  81 mg Oral Daily  . bisoprolol  2.5 mg Oral Daily  . clopidogrel  75 mg Oral Daily  . collagenase   Topical Daily  . famotidine  10 mg Oral Daily  . insulin aspart  0-5 Units Subcutaneous QHS  . insulin aspart  0-9 Units Subcutaneous TID WC  . insulin aspart  3 Units Subcutaneous TID WC  . polyethylene glycol  17 g Oral Daily  . rosuvastatin  20 mg Oral q1800  . senna-docusate  1 tablet Oral QHS  . topiramate  25 mg Oral QHS   Continuous Infusions: . cefTRIAXone (ROCEPHIN)  IV 1 g (05/06/19 0910)  . heparin 1,900 Units/hr (05/06/19 0918)   PRN Meds: acetaminophen **OR** acetaminophen, gabapentin, ipratropium-albuterol   Vital Signs    Vitals:   05/05/19 2005 05/05/19 2300 05/06/19 0300 05/06/19 0759  BP: (!) 162/68  (!) 143/74 (!) 147/65  Pulse: 79 82 84 73  Resp: 18 (!) 21 16 17   Temp: 99.6 F (37.6 C) (!) 100.6 F (38.1 C) 98.8 F (37.1 C) 98.6 F (37 C)  TempSrc: Oral Oral Oral Oral  SpO2: 97% 97% 98% 95%  Weight:        Intake/Output Summary (Last 24 hours) at 05/06/2019 2202 Last data filed at 05/06/2019 0300 Gross per 24 hour  Intake 339 ml  Output 2050 ml  Net -1711 ml   Last 3 Weights 05/03/2019  Weight (lbs) 175 lb  Weight (kg) 79.379 kg      Telemetry    NSR  - Personally Reviewed  ECG     NSR  -,  TWI in I, aVL ,  Personally Reviewed  Physical Exam   GEN: elderly female, appears to be weak,  nAD  Neck: No JVD Cardiac: RRR, no murmurs, rubs, or gallops.  Respiratory: Clear to auscultation bilaterally. GI: Soft, nontender, non-distended  MS: No edema; No deformity. Neuro:   Nonfocal  Psych: Normal affect   Labs    High Sensitivity Troponin:   Recent Labs  Lab 05/02/19 2332 05/03/19 0615  TROPONINIHS 755* 609*      Cardiac EnzymesNo results for input(s): TROPONINI in the last 168 hours. No results for input(s): TROPIPOC in the last 168 hours.   Chemistry Recent Labs  Lab 05/02/19 2332  05/04/19 0753 05/05/19 0719 05/06/19 0008  NA 133*   < > 135 135 133*  K 4.1   < > 4.7 3.9 4.0  CL 102   < > 102 103 100  CO2 21*   < > 19* 20* 22  GLUCOSE 165*   < > 199* 175* 173*  BUN 30*   < > 28* 28* 31*  CREATININE 1.61*   < > 1.42* 1.31* 1.54*  CALCIUM 9.2   < > 9.0 8.7* 8.5*  PROT 6.6  --   --   --   --   ALBUMIN 2.9*  --   --   --   --   AST 24  --   --   --   --   ALT  28  --   --   --   --   ALKPHOS 79  --   --   --   --   BILITOT 0.6  --   --   --   --   GFRNONAA 32*   < > 37* 41* 33*  GFRAA 37*   < > 43* 47* 39*  ANIONGAP 10   < > 14 12 11    < > = values in this interval not displayed.     Hematology Recent Labs  Lab 05/04/19 0532 05/05/19 0719 05/06/19 0008  WBC 17.6* 15.8* 15.1*  RBC 3.26* 3.15* 2.86*  HGB 9.9* 9.2* 8.5*  HCT 29.9* 28.6* 25.8*  MCV 91.7 90.8 90.2  MCH 30.4 29.2 29.7  MCHC 33.1 32.2 32.9  RDW 12.4 12.2 12.2  PLT 289 336 321    BNPNo results for input(s): BNP, PROBNP in the last 168 hours.   DDimer No results for input(s): DDIMER in the last 168 hours.   Radiology    No results found.  Cardiac Studies      Patient Profile     73 y.o. female with hx of PAD, HTN,  CHF,  Presents with NSTEMI   Assessment & Plan    1.  CAD :  S/p SNTEMI.   Has diuresed well.   I agree with plans for cath.  covid - as of June 25. Ive discussed risks, benefits, options.   She understands and agrees to proceed.  Will recheck BMP tomorrow .   2.  CKD:  Mild.  Creatinine is 1.5 today .   Recheck tomorrow   3.  HTN:  Fairly stable          For questions or updates, please contact [facility] Please consult  [email or web address] for contact info under        Signed, Kristeen MissPhilip Leonna Schlee, MD  05/06/2019, 9:28 AM

## 2019-05-06 NOTE — Progress Notes (Signed)
Progress Note  Patient Name: Stephanie [name] Date of Encounter: 05/06/2019  Primary Cardiologist: Branch  Subjective   73 yo with hx of CHF, UTIs, HTN, COPD , PAD ,   Presents with recent worsening .  dyspnea,  troponins suggest NSTEMI.   ECG  .  She has diuresed 2.6 liters so far this admission   Inpatient Medications    Scheduled Meds: . aspirin EC  81 mg Oral Daily  . bisoprolol  2.5 mg Oral Daily  . clopidogrel  75 mg Oral Daily  . collagenase   Topical Daily  . famotidine  10 mg Oral Daily  . insulin aspart  0-5 Units Subcutaneous QHS  . insulin aspart  0-9 Units Subcutaneous TID WC  . insulin aspart  3 Units Subcutaneous TID WC  . polyethylene glycol  17 g Oral Daily  . rosuvastatin  20 mg Oral q1800  . senna-docusate  1 tablet Oral QHS  . topiramate  25 mg Oral QHS   Continuous Infusions: . cefTRIAXone (ROCEPHIN)  IV 1 g (05/06/19 0910)  . heparin 1,900 Units/hr (05/06/19 0918)   PRN Meds: acetaminophen **OR** acetaminophen, gabapentin, ipratropium-albuterol   Vital Signs    Vitals:   05/05/19 2005 05/05/19 2300 05/06/19 0300 05/06/19 0759  BP: (!) 162/68  (!) 143/74 (!) 147/65  Pulse: 79 82 84 73  Resp: 18 (!) 21 16 17   Temp: 99.6 F (37.6 C) (!) 100.6 F (38.1 C) 98.8 F (37.1 C) 98.6 F (37 C)  TempSrc: Oral Oral Oral Oral  SpO2: 97% 97% 98% 95%  Weight:        Intake/Output Summary (Last 24 hours) at 05/06/2019 2202 Last data filed at 05/06/2019 0300 Gross per 24 hour  Intake 339 ml  Output 2050 ml  Net -1711 ml   Last 3 Weights 05/03/2019  Weight (lbs) 175 lb  Weight (kg) 79.379 kg      Telemetry    NSR  - Personally Reviewed  ECG     NSR  -,  TWI in I, aVL ,  Personally Reviewed  Physical Exam   GEN: elderly female, appears to be weak,  nAD  Neck: No JVD Cardiac: RRR, no murmurs, rubs, or gallops.  Respiratory: Clear to auscultation bilaterally. GI: Soft, nontender, non-distended  MS: No edema; No deformity. Neuro:   Nonfocal  Psych: Normal affect   Labs    High Sensitivity Troponin:   Recent Labs  Lab 05/02/19 2332 05/03/19 0615  TROPONINIHS 755* 609*      Cardiac EnzymesNo results for input(s): TROPONINI in the last 168 hours. No results for input(s): TROPIPOC in the last 168 hours.   Chemistry Recent Labs  Lab 05/02/19 2332  05/04/19 0753 05/05/19 0719 05/06/19 0008  NA 133*   < > 135 135 133*  K 4.1   < > 4.7 3.9 4.0  CL 102   < > 102 103 100  CO2 21*   < > 19* 20* 22  GLUCOSE 165*   < > 199* 175* 173*  BUN 30*   < > 28* 28* 31*  CREATININE 1.61*   < > 1.42* 1.31* 1.54*  CALCIUM 9.2   < > 9.0 8.7* 8.5*  PROT 6.6  --   --   --   --   ALBUMIN 2.9*  --   --   --   --   AST 24  --   --   --   --   ALT  28  --   --   --   --   ALKPHOS 79  --   --   --   --   BILITOT 0.6  --   --   --   --   GFRNONAA 32*   < > 37* 41* 33*  GFRAA 37*   < > 43* 47* 39*  ANIONGAP 10   < > 14 12 11   < > = values in this interval not displayed.     Hematology Recent Labs  Lab 05/04/19 0532 05/05/19 0719 05/06/19 0008  WBC 17.6* 15.8* 15.1*  RBC 3.26* 3.15* 2.86*  HGB 9.9* 9.2* 8.5*  HCT 29.9* 28.6* 25.8*  MCV 91.7 90.8 90.2  MCH 30.4 29.2 29.7  MCHC 33.1 32.2 32.9  RDW 12.4 12.2 12.2  PLT 289 336 321    BNPNo results for input(s): BNP, PROBNP in the last 168 hours.   DDimer No results for input(s): DDIMER in the last 168 hours.   Radiology    No results found.  Cardiac Studies      Patient Profile     72 y.o. female with hx of PAD, HTN,  CHF,  Presents with NSTEMI   Assessment & Plan    1.  CAD :  S/p SNTEMI.   Has diuresed well.   I agree with plans for cath.  covid - as of June 25. Ive discussed risks, benefits, options.   She understands and agrees to proceed.  Will recheck BMP tomorrow .   2.  CKD:  Mild.  Creatinine is 1.5 today .   Recheck tomorrow   3.  HTN:  Fairly stable          For questions or updates, please contact [facility] Please consult  [email or web address] for contact info under        Signed, Lam Mccubbins, MD  05/06/2019, 9:28 AM    

## 2019-05-06 NOTE — Progress Notes (Signed)
ANTICOAGULATION CONSULT NOTE - Follow Up Consult  Pharmacy Consult for heparin Indication: NSTEMI  Labs: Recent Labs    05/03/19 0615  05/04/19 0532 05/04/19 0753  05/05/19 0719 05/05/19 1538 05/06/19 0008  HGB  --    < > 9.9*  --   --  9.2*  --  8.5*  HCT  --    < > 29.9*  --   --  28.6*  --  25.8*  PLT  --    < > 289  --   --  336  --  321  HEPARINUNFRC  --    < > 0.26*  --    < > 0.33 0.10* 0.35  CREATININE  --    < >  --  1.42*  --  1.31*  --  1.54*  TROPONINIHS 609*  --   --   --   --   --   --   --    < > = values in this interval not displayed.    Assessment/Plan:  73yo female therapeutic on heparin after rate change. Will continue gtt at current rate and confirm stable with additional level.   Wynona Neat, PharmD, BCPS  05/06/2019,1:49 AM

## 2019-05-07 ENCOUNTER — Encounter (HOSPITAL_COMMUNITY)
Admission: AD | Disposition: A | Discharge status: Skilled Nursing Facility | Payer: Self-pay | Source: Other Acute Inpatient Hospital | Attending: Thoracic Surgery (Cardiothoracic Vascular Surgery)

## 2019-05-07 ENCOUNTER — Other Ambulatory Visit: Payer: Self-pay | Admitting: *Deleted

## 2019-05-07 DIAGNOSIS — I2510 Atherosclerotic heart disease of native coronary artery without angina pectoris: Secondary | ICD-10-CM

## 2019-05-07 DIAGNOSIS — I214 Non-ST elevation (NSTEMI) myocardial infarction: Secondary | ICD-10-CM

## 2019-05-07 DIAGNOSIS — I10 Essential (primary) hypertension: Secondary | ICD-10-CM

## 2019-05-07 DIAGNOSIS — D649 Anemia, unspecified: Secondary | ICD-10-CM

## 2019-05-07 DIAGNOSIS — I25110 Atherosclerotic heart disease of native coronary artery with unstable angina pectoris: Secondary | ICD-10-CM

## 2019-05-07 HISTORY — PX: INTRAVASCULAR PRESSURE WIRE/FFR STUDY: CATH118243

## 2019-05-07 HISTORY — PX: LEFT HEART CATH AND CORONARY ANGIOGRAPHY: CATH118249

## 2019-05-07 LAB — BASIC METABOLIC PANEL
Anion gap: 9 (ref 5–15)
BUN: 25 mg/dL — ABNORMAL HIGH (ref 8–23)
CO2: 20 mmol/L — ABNORMAL LOW (ref 22–32)
Calcium: 8.8 mg/dL — ABNORMAL LOW (ref 8.9–10.3)
Chloride: 107 mmol/L (ref 98–111)
Creatinine, Ser: 1.1 mg/dL — ABNORMAL HIGH (ref 0.44–1.00)
GFR calc Af Amer: 58 mL/min — ABNORMAL LOW (ref >60)
GFR calc non Af Amer: 50 mL/min — ABNORMAL LOW (ref >60)
Glucose, Bld: 178 mg/dL — ABNORMAL HIGH (ref 70–99)
Potassium: 3.9 mmol/L (ref 3.5–5.1)
Sodium: 136 mmol/L (ref 135–145)

## 2019-05-07 LAB — GLUCOSE, CAPILLARY
Glucose-Capillary: 162 mg/dL — ABNORMAL HIGH (ref 70–99)
Glucose-Capillary: 201 mg/dL — ABNORMAL HIGH (ref 70–99)
Glucose-Capillary: 155 mg/dL — ABNORMAL HIGH (ref 70–99)
Glucose-Capillary: 138 mg/dL — ABNORMAL HIGH (ref 70–99)
Glucose-Capillary: 220 mg/dL — ABNORMAL HIGH (ref 70–99)
Glucose-Capillary: 144 mg/dL — ABNORMAL HIGH (ref 70–99)
Glucose-Capillary: 185 mg/dL — ABNORMAL HIGH (ref 70–99)

## 2019-05-07 LAB — CBC
HCT: 26.8 % — ABNORMAL LOW (ref 36.0–46.0)
Hemoglobin: 8.7 g/dL — ABNORMAL LOW (ref 12.0–15.0)
MCH: 29.2 pg (ref 26.0–34.0)
MCHC: 32.5 g/dL (ref 30.0–36.0)
MCV: 89.9 fL (ref 80.0–100.0)
Platelets: 318 10*3/uL (ref 150–400)
RBC: 2.98 MIL/uL — ABNORMAL LOW (ref 3.87–5.11)
RDW: 12.3 % (ref 11.5–15.5)
WBC: 14.7 10*3/uL — ABNORMAL HIGH (ref 4.0–10.5)
nRBC: 0 % (ref 0.0–0.2)

## 2019-05-07 LAB — POCT ACTIVATED CLOTTING TIME
Activated Clotting Time: 285 seconds
Activated Clotting Time: 246 seconds
Activated Clotting Time: 191 seconds
Activated Clotting Time: 175 seconds

## 2019-05-07 LAB — PLATELET INHIBITION P2Y12: Platelet Function  P2Y12: 316 [PRU] (ref 182–335)

## 2019-05-07 LAB — HEPARIN LEVEL (UNFRACTIONATED): Heparin Unfractionated: 0.54 IU/mL (ref 0.30–0.70)

## 2019-05-07 SURGERY — LEFT HEART CATH AND CORONARY ANGIOGRAPHY
Anesthesia: LOCAL

## 2019-05-07 MED ORDER — LABETALOL HCL 5 MG/ML IV SOLN: 10.0000 mg | INTRAVENOUS | Status: AC | PRN

## 2019-05-07 MED ORDER — SODIUM CHLORIDE 0.9% FLUSH: 3.0000 mL | INTRAVENOUS | Status: DC | PRN

## 2019-05-07 MED ORDER — ASPIRIN 81 MG PO TBEC
81.0000 mg | DELAYED_RELEASE_TABLET | Freq: Every day | ORAL | Status: DC
  Filled 2019-05-07: qty 1

## 2019-05-07 MED ORDER — MIDAZOLAM HCL 2 MG/2ML IJ SOLN
INTRAMUSCULAR | Status: AC
  Filled 2019-05-07: qty 2

## 2019-05-07 MED ORDER — HEPARIN SODIUM (PORCINE) 1000 UNIT/ML IJ SOLN
INTRAMUSCULAR | Status: AC
  Filled 2019-05-07: qty 1

## 2019-05-07 MED ORDER — SODIUM CHLORIDE 0.9 % IV SOLN: INTRAVENOUS | Status: AC

## 2019-05-07 MED ORDER — SODIUM CHLORIDE 0.9% FLUSH
3.0000 mL | Freq: Two times a day (BID) | INTRAVENOUS | Status: DC
  Administered 2019-05-07 – 2019-05-08 (×3): 3 mL via INTRAVENOUS

## 2019-05-07 MED ORDER — SODIUM CHLORIDE 0.9 % IV SOLN
INTRAVENOUS | Status: AC | PRN
  Administered 2019-05-07: 250 mL via INTRAVENOUS

## 2019-05-07 MED ORDER — ACETAMINOPHEN 325 MG PO TABS: 650.0000 mg | ORAL_TABLET | ORAL | Status: DC | PRN

## 2019-05-07 MED ORDER — VERAPAMIL HCL 2.5 MG/ML IV SOLN
INTRAVENOUS | Status: AC
  Filled 2019-05-07: qty 2

## 2019-05-07 MED ORDER — ROSUVASTATIN CALCIUM 5 MG PO TABS
10.0000 mg | ORAL_TABLET | Freq: Every day | ORAL | Status: DC
  Administered 2019-05-07 – 2019-05-14 (×7): 10 mg via ORAL
  Filled 2019-05-07 (×7): qty 2

## 2019-05-07 MED ORDER — INSULIN ASPART 100 UNIT/ML ~~LOC~~ SOLN
0.0000 [IU] | Freq: Three times a day (TID) | SUBCUTANEOUS | Status: DC
  Administered 2019-05-08: 09:00:00 4 [IU] via SUBCUTANEOUS
  Administered 2019-05-08 (×2): 2 [IU] via SUBCUTANEOUS

## 2019-05-07 MED ORDER — ONDANSETRON HCL 4 MG/2ML IJ SOLN: 4.0000 mg | Freq: Four times a day (QID) | INTRAMUSCULAR | Status: DC | PRN

## 2019-05-07 MED ORDER — HEPARIN (PORCINE) 25000 UT/250ML-% IV SOLN
1900.0000 [IU]/h | INTRAVENOUS | Status: DC
  Administered 2019-05-07 – 2019-05-08 (×2): 1900 [IU]/h via INTRAVENOUS
  Filled 2019-05-07 (×3): qty 250

## 2019-05-07 MED ORDER — FENTANYL CITRATE (PF) 100 MCG/2ML IJ SOLN
INTRAMUSCULAR | Status: DC | PRN
  Administered 2019-05-07 (×2): 25 ug via INTRAVENOUS

## 2019-05-07 MED ORDER — MIDAZOLAM HCL 2 MG/2ML IJ SOLN
INTRAMUSCULAR | Status: DC | PRN
  Administered 2019-05-07: 1 mg via INTRAVENOUS

## 2019-05-07 MED ORDER — ADENOSINE 12 MG/4ML IV SOLN
INTRAVENOUS | Status: AC
  Filled 2019-05-07: qty 12

## 2019-05-07 MED ORDER — INSULIN GLARGINE 100 UNIT/ML ~~LOC~~ SOLN
20.0000 [IU] | Freq: Every day | SUBCUTANEOUS | Status: DC
  Administered 2019-05-07: 23:00:00 20 [IU] via SUBCUTANEOUS
  Filled 2019-05-07 (×2): qty 0.2

## 2019-05-07 MED ORDER — LIDOCAINE HCL (PF) 1 % IJ SOLN
INTRAMUSCULAR | Status: DC | PRN
  Administered 2019-05-07: 2 mL
  Administered 2019-05-07: 15 mL

## 2019-05-07 MED ORDER — PAROXETINE HCL ER 37.5 MG PO TB24
75.0000 mg | ORAL_TABLET | Freq: Every day | ORAL | Status: DC
  Administered 2019-05-07 – 2019-05-08 (×2): 75 mg via ORAL
  Filled 2019-05-07 (×4): qty 2

## 2019-05-07 MED ORDER — IOHEXOL 350 MG/ML SOLN
INTRAVENOUS | Status: DC | PRN
  Administered 2019-05-07: 120 mL via INTRACARDIAC

## 2019-05-07 MED ORDER — FENTANYL CITRATE (PF) 100 MCG/2ML IJ SOLN
INTRAMUSCULAR | Status: AC
  Filled 2019-05-07: qty 2

## 2019-05-07 MED ORDER — HYDRALAZINE HCL 20 MG/ML IJ SOLN: 10.0000 mg | INTRAMUSCULAR | Status: AC | PRN

## 2019-05-07 MED ORDER — NITROGLYCERIN 1 MG/10 ML FOR IR/CATH LAB
INTRA_ARTERIAL | Status: AC
  Filled 2019-05-07: qty 10

## 2019-05-07 MED ORDER — ASPIRIN 81 MG PO CHEW
81.0000 mg | CHEWABLE_TABLET | Freq: Every day | ORAL | Status: DC
  Administered 2019-05-07 – 2019-05-08 (×2): 81 mg via ORAL
  Filled 2019-05-07 (×2): qty 1

## 2019-05-07 MED ORDER — IPRATROPIUM-ALBUTEROL 20-100 MCG/ACT IN AERS: 1.0000 | INHALATION_SPRAY | Freq: Four times a day (QID) | RESPIRATORY_TRACT | Status: DC | PRN

## 2019-05-07 MED ORDER — HEPARIN (PORCINE) IN NACL 1000-0.9 UT/500ML-% IV SOLN
INTRAVENOUS | Status: AC
  Filled 2019-05-07: qty 1000

## 2019-05-07 MED ORDER — LINAGLIPTIN 5 MG PO TABS
5.0000 mg | ORAL_TABLET | Freq: Every day | ORAL | Status: DC
  Administered 2019-05-07 – 2019-05-08 (×2): 5 mg via ORAL
  Filled 2019-05-07 (×2): qty 1

## 2019-05-07 MED ORDER — HEPARIN (PORCINE) IN NACL 1000-0.9 UT/500ML-% IV SOLN
INTRAVENOUS | Status: DC | PRN
  Administered 2019-05-07 (×3): 500 mL

## 2019-05-07 MED ORDER — SODIUM CHLORIDE 0.9 % IV SOLN: 250.0000 mL | INTRAVENOUS | Status: DC | PRN

## 2019-05-07 MED ORDER — HEPARIN (PORCINE) IN NACL 1000-0.9 UT/500ML-% IV SOLN
INTRAVENOUS | Status: AC
  Filled 2019-05-07: qty 500

## 2019-05-07 MED ORDER — LIDOCAINE HCL (PF) 1 % IJ SOLN
INTRAMUSCULAR | Status: AC
  Filled 2019-05-07: qty 30

## 2019-05-07 MED ORDER — ADENOSINE (DIAGNOSTIC) 140MCG/KG/MIN
INTRAVENOUS | Status: DC | PRN
  Administered 2019-05-07: 140 ug/kg/min via INTRAVENOUS

## 2019-05-07 MED ORDER — HEPARIN SODIUM (PORCINE) 1000 UNIT/ML IJ SOLN
INTRAMUSCULAR | Status: DC | PRN
  Administered 2019-05-07: 10000 [IU] via INTRAVENOUS

## 2019-05-07 MED ORDER — MORPHINE SULFATE (PF) 2 MG/ML IV SOLN: 2.0000 mg | INTRAVENOUS | Status: DC | PRN

## 2019-05-07 MED ORDER — VERAPAMIL HCL 2.5 MG/ML IV SOLN
INTRA_ARTERIAL | Status: DC | PRN
  Administered 2019-05-07: 10 mL via INTRA_ARTERIAL

## 2019-05-07 MED ORDER — ADENOSINE 12 MG/4ML IV SOLN
INTRAVENOUS | Status: AC
  Filled 2019-05-07: qty 4

## 2019-05-07 SURGICAL SUPPLY — 19 items
CATH INFINITI 5FR MULTPACK ANG (CATHETERS) ×1 IMPLANT
CATH MICROCATH NAVVUS (MICROCATHETER) IMPLANT
CATH OPTITORQUE TIG 4.0 5F (CATHETERS) ×1 IMPLANT
CATH VISTA GUIDE 6FR XBLAD3.5 (CATHETERS) ×1 IMPLANT
DEVICE RAD COMP TR BAND LRG (VASCULAR PRODUCTS) ×1 IMPLANT
GLIDESHEATH SLEND A-KIT 6F 22G (SHEATH) ×1 IMPLANT
GUIDEWIRE INQWIRE 1.5J.035X260 (WIRE) IMPLANT
INQWIRE 1.5J .035X260CM (WIRE)
KIT ENCORE 26 ADVANTAGE (KITS) ×1 IMPLANT
KIT HEART LEFT (KITS) ×2 IMPLANT
MICROCATHETER NAVVUS (MICROCATHETER) ×2
PACK CARDIAC CATHETERIZATION (CUSTOM PROCEDURE TRAY) ×2 IMPLANT
SHEATH PINNACLE 5F 10CM (SHEATH) ×1 IMPLANT
SHEATH PINNACLE 6F 10CM (SHEATH) ×1 IMPLANT
TRANSDUCER W/STOPCOCK (MISCELLANEOUS) ×2 IMPLANT
TUBING CIL FLEX 10 FLL-RA (TUBING) ×2 IMPLANT
WIRE ASAHI PROWATER 180CM (WIRE) ×1 IMPLANT
WIRE EMERALD 3MM-J .035X150CM (WIRE) ×1 IMPLANT
WIRE HI TORQ VERSACORE-J 145CM (WIRE) ×1 IMPLANT

## 2019-05-07 NOTE — Progress Notes (Signed)
Sheath removed at: 1440; bedrest for 6 hours to begin at 1500 Manual pressure held for: 20 mins Pulses: right DP doppler Pain: none  VS: BP 133/58, HR 60, RR 15, O2 sat 97% on room air Site condition: level 0 Complications: none  Patient going to: 2W 15

## 2019-05-07 NOTE — Progress Notes (Signed)
TCTS consulted for CABG evaluation. °

## 2019-05-07 NOTE — Progress Notes (Signed)
Subjective: [name] was seen and evaluated today post left heart cath. She is doing well after procedue. Denies any chest pain, no shortness of breath. She is informed about having blockage in her heart vessles and that she may need further procedure.   Objective:  Vital signs in last 24 hours: Vitals:   05/06/19 2335 05/07/19 0322 05/07/19 0427 05/07/19 0454  BP: (!) 136/53 (!) 142/55    Pulse: 68 71    Resp: 18 16    Temp: 99.1 F (37.3 C) 98.8 F (37.1 C)    TempSrc: Oral Oral    SpO2: 94% 97%    Weight:   91.6 kg 91.7 kg  Height:    5\' 6"  (1.676 m)   Physical Exam Constitutional:      General: She is not in acute distress.    Appearance: Normal appearance.  Cardiovascular:     Rate and Rhythm: Normal rate and regular rhythm.     Pulses: Normal pulses.     Heart sounds: Normal heart sounds. No murmur.  Pulmonary:     Effort: Pulmonary effort is normal.     Breath sounds: Normal breath sounds. No wheezing or rales.  Abdominal:     General: Bowel sounds are normal.     Palpations: Abdomen is soft.     Tenderness: There is no abdominal tenderness.  Neurological:     Mental Status: She is alert and oriented to person, place, and time.     Assessment/Plan:  Principal Problem:   NSTEMI (non-ST elevated myocardial infarction) ([facility]) Active Problems:   COPD (chronic obstructive pulmonary disease) ([facility])   Depression   Diabetes mellitus without complication (HCC)   HTN (hypertension)   Acute on chronic respiratory failure with hypoxia (HCC)   Leukocytosis   Acute hypoxemic respiratory failure (HCC)   Pressure injury of skin   NSTEMI CHF The patient presented with elevated troponin 755>609 with ekg showing t wave inversions in the inferior and lateral leads.  Chest pain free since admission.  LH cath performed today 05/07/2019 and showed:   Mid RCA lesion is 90% stenosed.  Ost Cx to Prox Cx lesion is 95% stenosed.  Ost LAD to Prox LAD lesion is 60%  stenosed.  2nd Mrg lesion is 90% stenosed.  Ramus lesion is 60% stenosed.   -Cardiology is following and consulted CT surgery. She is tentatively scheduled for CABG X 4 later this week or early next week pending her lab results.  -Continue aspirin, Plavix, Crestor, bisoprolol 2.5 mg daily -Restarted on IV Hep 6/30 9 PM  Possible pyelonephritis: Afebrile. No urinary symptoms or pain Leukocytosis trending down 14.7 today from 15.2 from 25.8 yesterday Blood culture 6/26 no growth  Urine cutlure 6/26 no growth  -Continue Ceftriaxone (started 6/27). de-escalated from meropenem   Acute Kidney Injury Improving. Cr 1.1 today -Will monitor and holding lisinopril -BMP daily  Hypertension Blood pressure acceptable at 130s-140s  -Continue home Bisoprolol 2.5 mg qd  - held hydralazinedue to dehydration -Held lisinopril due to AKI. May consider resuming tomorrow   Type II DM Glucose ranging as below  CBG (last 3)  Recent Labs    05/07/19 1209 05/07/19 1509 05/07/19 1718  GLUCAP 155* 138* 220*    -Continue ssi -Continue novolog 3units tid Continue Lantus 20 u at bed time -Continue Trajenta 5 mg QD -CBG monitoring  Diet: HH/CM IV fluid: NS started today for 8 h VTE ppx: IV Hep Code status: Full code   Dispo: Anticipated  discharge depend on cardiology recommendation  Dewayne Hatch, MD 05/07/2019, 4:56 AM Pager: [phone]

## 2019-05-07 NOTE — Progress Notes (Signed)
On call paged re: heparin orders vs. notes - per provider keep heparin running until procedure at this time

## 2019-05-07 NOTE — Interval H&P Note (Signed)
Cath Lab Visit (complete for each Cath Lab visit)  Clinical Evaluation Leading to the Procedure:   ACS: No.  Non-ACS:    Anginal Classification: CCS I  Anti-ischemic medical therapy: No Therapy  Non-Invasive Test Results: No non-invasive testing performed  Prior CABG: No previous CABG      History and Physical Interval Note:  05/07/2019 10:03 AM  Stephanie [name]  has presented today for surgery, with the diagnosis of N STEMI.  The various methods of treatment have been discussed with the patient and family. After consideration of risks, benefits and other options for treatment, the patient has consented to  Procedure(s): LEFT HEART CATH AND CORONARY ANGIOGRAPHY (N/A) as a surgical intervention.  The patient's history has been reviewed, patient examined, no change in status, stable for surgery.  I have reviewed the patient's chart and labs.  Questions were answered to the patient's satisfaction.     Quay Burow

## 2019-05-07 NOTE — Progress Notes (Signed)
ANTICOAGULATION CONSULT NOTE - Follow Up Consult  Pharmacy Consult for Heparin Indication: 3vCAD awaiting CABG   Patient Measurements: Height: 5\' 6"  (167.6 cm) Weight: 202 lb 2.6 oz (91.7 kg) IBW/kg (Calculated) : 59.3 Heparin Dosing Weight: 79.4 kg  Vital Signs: Temp: 98.8 F (37.1 C) (06/30 0739) Temp Source: Oral (06/30 0739) BP: 142/47 (06/30 1201) Pulse Rate: 64 (06/30 1201)  Labs: Recent Labs    05/05/19 0719  05/06/19 0008 05/06/19 0823 05/07/19 0319  HGB 9.2*  --  8.5*  --  8.7*  HCT 28.6*  --  25.8*  --  26.8*  PLT 336  --  321  --  318  HEPARINUNFRC 0.33   < > 0.35 0.48 0.54  CREATININE 1.31*  --  1.54*  --  1.10*   < > = values in this interval not displayed.    Estimated Creatinine Clearance: 52.8 mL/min (A) (by C-G formula based on SCr of 1.1 mg/dL (H)).   Assessment: 6 YOF on Heparin for ACS now s/p cath 6/30 which showed 3vCAD with plans to consult CVTS for CABG. Pharmacy consulted to resume Heparin 6 hours after sheath removal.  Sheath removed at 1440 - will plan to resume Heparin at the previous rate this evening.   Goal of Therapy:  Heparin level 0.3-0.7 units/ml Monitor platelets by anticoagulation protocol: Yes  Plan:  - Resume Heparin at 1900 units/hr (19 ml/hr) starting at 2100 today - Will continue to monitor for any signs/symptoms of bleeding and will follow up with heparin level in 8 hours   Thank you for allowing pharmacy to be a part of this patient's care.  Alycia Rossetti, PharmD, BCPS Clinical Pharmacist Clinical phone for 05/07/2019: [phone] 05/07/2019 5:37 PM   **Pharmacist phone directory can now be found on [email or web address] (PW [email or web address]).  Listed under [place].

## 2019-05-07 NOTE — Consult Note (Signed)
[address]       [place] [id number]             [phone]                    Kelby Famoni Stephanie Long [place] Medical Record #[id number] Date of Birth: 24-Nov-1945  Referring: No ref. provider found Primary Care: Levin ErpJones, Brittney M, NP Primary Cardiologist: No primary care provider on file.  Chief Complaint:   No chief complaint on file.   History of Present Illness:    Stephanie [name] Stephanie [name] 73 y.o. female [facility] after being admitted for worsening shortness of breath.  She was noted to have elevated troponins, and a BNP of 2310.  After medical optimization, and diuresis, she underwent a left heart cath which showed 3V disease.    She is currently resting comfortably after her LHC.  She currently denies any chest pain or shortness of breath.  She is currently being treated for a UTI.     Past Medical History:  Diagnosis Date  . COPD (chronic obstructive pulmonary disease) (HCC)   . Depression   . Diabetes mellitus without complication (HCC)   . HTN (hypertension)   . Hyperlipidemia   . PVD (peripheral vascular disease) (HCC)     Past Surgical History:  Procedure Laterality Date  . ABDOMINOPLASTY Right 04/26/2019   Verbalized per patient     Family History  Problem Relation Age of Onset  . Heart attack Mother   . High blood pressure Mother   . Heart attack Father   . High blood pressure Father   . Heart attack Brother      Social History   Tobacco Use  Smoking Status Former Smoker  Smokeless Tobacco Never Used    Social History   Substance and Sexual Activity  Alcohol Use Not Currently     Allergies  Allergen Reactions  . Penicillins Other (See Comments)    Per MAR Did it involve swelling of the face/tongue/throat, SOB, or low BP? Unknown Did it involve sudden or severe rash/hives, skin peeling, or any reaction on the inside of your mouth or nose? Unknown Did you need to seek medical  attention at a hospital or doctor's office? Unknown When did it last happen? Unk If all above answers are "NO", may proceed with cephalosporin use.     Current Facility-Administered Medications  Medication Dose Route Frequency Provider Last Rate Last Dose  . 0.9 %  sodium chloride infusion  250 mL Intravenous PRN Nahser, Deloris PingPhilip J, MD      . 0.9 %  sodium chloride infusion  250 mL Intravenous PRN Nahser, Deloris PingPhilip J, MD      . 0.9 %  sodium chloride infusion   Intravenous Continuous Runell GessBerry, Jonathan J, MD 75 mL/hr at 05/07/19 1228    . 0.9% sodium chloride infusion  1 mL/kg/hr Intravenous Continuous Nahser, Deloris PingPhilip J, MD 79.4 mL/hr at 05/07/19 0532 1 mL/kg/hr at 05/07/19 0532  . [MAR Hold] acetaminophen (TYLENOL) tablet 650 mg  650 mg Oral Q6H PRN Angelita InglesWinfrey, William B, MD   650 mg at 05/06/19 2021   Or  . [MAR Hold] acetaminophen (TYLENOL) suppository 650 mg  650 mg Rectal Q6H PRN Angelita InglesWinfrey, William B, MD   650 mg at 05/03/19 0215  . [MAR Hold] aspirin EC tablet 81 mg  81 mg Oral Daily Angelita InglesWinfrey, William B, MD   81 mg at 05/07/19  [id number]  . Kei.Heading[MAR Hold] bisoprolol (ZEBETA) tablet 2.5 mg  2.5 mg Oral Daily Angelita InglesWinfrey, William B, MD   2.5 mg at 05/07/19 0845  . [MAR Hold] cefTRIAXone (ROCEPHIN) 1 g in sodium chloride 0.9 % 100 mL IVPB  1 g Intravenous Q24H Mosetta AnisBitonti, Michael T, RPH 200 mL/hr at 05/06/19 0910 1 g at 05/06/19 0910  . [MAR Hold] clopidogrel (PLAVIX) tablet 75 mg  75 mg Oral Daily Angelita InglesWinfrey, William B, MD   75 mg at 05/07/19 [phone]  . [MAR Hold] collagenase (SANTYL) ointment   Topical Daily Earl LagosNarendra, Nischal, MD      . Mitzi Hansen[MAR Hold] famotidine (PEPCID) tablet 10 mg  10 mg Oral Daily Angelita InglesWinfrey, William B, MD   10 mg at 05/07/19 0844  . [MAR Hold] gabapentin (NEURONTIN) capsule 200 mg  200 mg Oral BID PRN Angelita InglesWinfrey, William B, MD   200 mg at 05/06/19 0221  . heparin ADULT infusion 100 units/mL (25000 units/28050mL sodium chloride 0.45%)  1,900 Units/hr Intravenous Continuous Otho BellowsGreen, Terri L, RPH   Stopped at  05/07/19 [phone]  . [MAR Hold] insulin aspart (novoLOG) injection 0-5 Units  0-5 Units Subcutaneous QHS Angelita InglesWinfrey, William B, MD      . Mitzi Hansen[MAR Hold] insulin aspart (novoLOG) injection 0-9 Units  0-9 Units Subcutaneous TID WC Angelita InglesWinfrey, William B, MD   3 Units at 05/07/19 [phone]  . [MAR Hold] insulin aspart (novoLOG) injection 3 Units  3 Units Subcutaneous TID WC Angelita InglesWinfrey, William B, MD   3 Units at 05/06/19 [phone]  . [MAR Hold] ipratropium-albuterol (DUONEB) 0.5-2.5 (3) MG/3ML nebulizer solution 3 mL  3 mL Inhalation Q6H PRN Earl LagosNarendra, Nischal, MD      . Mitzi Hansen[MAR Hold] polyethylene glycol (MIRALAX / GLYCOLAX) packet 17 g  17 g Oral Daily Angelita InglesWinfrey, William B, MD   Stopped at 05/07/19 [phone]  . [MAR Hold] rosuvastatin (CRESTOR) tablet 20 mg  20 mg Oral q1800 Angelita InglesWinfrey, William B, MD   20 mg at 05/06/19 1734  . [MAR Hold] senna-docusate (Senokot-S) tablet 1 tablet  1 tablet Oral QHS Angelita InglesWinfrey, William B, MD   1 tablet at 05/05/19 2235  . sodium chloride flush (NS) 0.9 % injection 3 mL  3 mL Intravenous Q12H Nahser, Deloris PingPhilip J, MD   3 mL at 05/06/19 2153  . sodium chloride flush (NS) 0.9 % injection 3 mL  3 mL Intravenous PRN Nahser, Deloris PingPhilip J, MD      . sodium chloride flush (NS) 0.9 % injection 3 mL  3 mL Intravenous Q12H Nahser, Deloris PingPhilip J, MD   3 mL at 05/06/19 2153  . sodium chloride flush (NS) 0.9 % injection 3 mL  3 mL Intravenous PRN Nahser, Deloris PingPhilip J, MD      . Mitzi Hansen[MAR Hold] topiramate (TOPAMAX) tablet 25 mg  25 mg Oral QHS Angelita InglesWinfrey, William B, MD   25 mg at 05/06/19 2022   ROS:  Pertinent items are noted in HPI.        PHYSICAL EXAMINATION: BP (!) 142/47   Pulse 64   Temp 98.8 F (37.1 C) (Oral)   Resp 12   Ht 5\' 6"  (1.676 m)   Wt 91.7 kg   SpO2 98%   BMI 32.63 kg/m  Arousable. NAD Sinus, Regular Clear B abd soft Trace peripheral edema   Diagnostic Studies & Laboratory data: LHC:  Mid RCA lesion is 90% stenosed.  Ost Cx to Prox Cx lesion is 95% stenosed.  Ost LAD to Prox LAD lesion is 60% stenosed.   2nd Mrg  lesion is 90% stenosed.  Ramus lesion is 60% stenosed.    I have independently reviewed the above radiology studies  and reviewed the findings with the patient.   Recent Lab Findings: Lab Results  Component Value Date   WBC 14.7 (H) 05/07/2019   HGB 8.7 (L) 05/07/2019   HCT 26.8 (L) 05/07/2019   PLT 318 05/07/2019   GLUCOSE 178 (H) 05/07/2019   CHOL 104 05/02/2019   TRIG 145 05/02/2019   HDL 38 (L) 05/02/2019   LDLCALC 37 05/02/2019   ALT 28 05/02/2019   AST 24 05/02/2019   NA 136 05/07/2019   K 3.9 05/07/2019   CL 107 05/07/2019   CREATININE 1.10 (H) 05/07/2019   BUN 25 (H) 05/07/2019   CO2 20 (L) 05/07/2019   HGBA1C 7.4 (H) 05/02/2019      Assessment / Plan:   73 yo female presents with NSTEMI, and 3 vessel CAD.  Good heart function, and no valvular disease.  She has been on plavix since 6/26.  We will check a P2Y12, and vein mapping.  She is tentatively scheduled for CABG X 4 later this week or early next week pending her lab results.        Lajuana Matte  05/07/2019 2:27 PM

## 2019-05-07 NOTE — Progress Notes (Signed)
ANTICOAGULATION CONSULT NOTE - Follow Up Consult  Pharmacy Consult for Heparin Indication: NSTEMI   Patient Measurements: Height: 5\' 6"  (167.6 cm) Weight: 202 lb 2.6 oz (91.7 kg) IBW/kg (Calculated) : 59.3 Heparin Dosing Weight: 79.4 kg  Vital Signs: Temp: 98.8 F (37.1 C) (06/30 0739) Temp Source: Oral (06/30 0739) BP: 149/57 (06/30 0739) Pulse Rate: 67 (06/30 0739)  Labs: Recent Labs    05/05/19 0719  05/06/19 0008 05/06/19 0823 05/07/19 0319  HGB 9.2*  --  8.5*  --  8.7*  HCT 28.6*  --  25.8*  --  26.8*  PLT 336  --  321  --  318  HEPARINUNFRC 0.33   < > 0.35 0.48 0.54  CREATININE 1.31*  --  1.54*  --  1.10*   < > = values in this interval not displayed.    Estimated Creatinine Clearance: 52.8 mL/min (A) (by C-G formula based on SCr of 1.1 mg/dL (H)).   Assessment:  Anticoag: Hep for ACS w/u. Planned Heparin x 48 hr per Cards, with Plavix/ASA, statin, but continuing Heparin infusion. Current heparin level is therapeutic. H/h plts wnl. Planning for cardiac cath today.   Goal of Therapy:  Heparin level 0.3-0.7 units/ml Monitor platelets by anticoagulation protocol: Yes    Plan:  -Continue IV heparin at 1900 units/hr -Daily HL and CBC -F/u after cath    Stephanie [name] 05/07/2019 10:00 AM

## 2019-05-07 NOTE — Progress Notes (Unsigned)
FTS

## 2019-05-07 NOTE — Progress Notes (Signed)
Progress Note  Patient Name: Stephanie [name] Date of Encounter: 05/07/2019  Primary Cardiologist: Harl Bowie  Subjective   73 yo with hx of CHF, UTIs, HTN, COPD , PAD ,   Presents with recent worsening .  dyspnea,  troponins suggest NSTEMI.   ECG  .  She has diuresed 5.8 liters so far this admission    Inpatient Medications    Scheduled Meds: . aspirin EC  81 mg Oral Daily  . bisoprolol  2.5 mg Oral Daily  . clopidogrel  75 mg Oral Daily  . collagenase   Topical Daily  . famotidine  10 mg Oral Daily  . insulin aspart  0-5 Units Subcutaneous QHS  . insulin aspart  0-9 Units Subcutaneous TID WC  . insulin aspart  3 Units Subcutaneous TID WC  . polyethylene glycol  17 g Oral Daily  . rosuvastatin  20 mg Oral q1800  . senna-docusate  1 tablet Oral QHS  . sodium chloride flush  3 mL Intravenous Q12H  . sodium chloride flush  3 mL Intravenous Q12H  . topiramate  25 mg Oral QHS   Continuous Infusions: . sodium chloride    . sodium chloride    . sodium chloride 1 mL/kg/hr (05/07/19 0532)  . cefTRIAXone (ROCEPHIN)  IV 1 g (05/06/19 0910)  . heparin 1,900 Units/hr (05/07/19 0135)   PRN Meds: sodium chloride, sodium chloride, acetaminophen **OR** acetaminophen, gabapentin, ipratropium-albuterol, sodium chloride flush, sodium chloride flush   Vital Signs    Vitals:   05/07/19 0322 05/07/19 0427 05/07/19 0454 05/07/19 0739  BP: (!) 142/55   (!) 149/57  Pulse: 71   67  Resp: 16   15  Temp: 98.8 F (37.1 C)   98.8 F (37.1 C)  TempSrc: Oral   Oral  SpO2: 97%   98%  Weight:  91.6 kg 91.7 kg   Height:   5\' 6"  (1.676 m)     Intake/Output Summary (Last 24 hours) at 05/07/2019 0854 Last data filed at 05/06/2019 2153 Gross per 24 hour  Intake 3 ml  Output 3200 ml  Net -3197 ml   Last 3 Weights 05/07/2019 05/07/2019 05/03/2019  Weight (lbs) 202 lb 2.6 oz 201 lb 15.1 oz 175 lb  Weight (kg) 91.7 kg 91.6 kg 79.379 kg      Telemetry    NSR  - Personally Reviewed  ECG      NSR  -,  TWI in I, aVL ,  Personally Reviewed  Physical Exam   GEN: elderly female, appears to be weak,  nAD  Neck: No JVD Cardiac: RRR, no murmurs, rubs, or gallops.  Respiratory: Clear to auscultation bilaterally. GI: Soft, nontender, non-distended  MS: No edema; No deformity. Neuro:  Nonfocal  Psych: Normal affect   Labs    High Sensitivity Troponin:   Recent Labs  Lab 05/02/19 2332 05/03/19 0615  TROPONINIHS 755* 609*      Cardiac EnzymesNo results for input(s): TROPONINI in the last 168 hours. No results for input(s): TROPIPOC in the last 168 hours.   Chemistry Recent Labs  Lab 05/02/19 2332  05/05/19 0719 05/06/19 0008 05/07/19 0319  NA 133*   < > 135 133* 136  K 4.1   < > 3.9 4.0 3.9  CL 102   < > 103 100 107  CO2 21*   < > 20* 22 20*  GLUCOSE 165*   < > 175* 173* 178*  BUN 30*   < > 28* 31* 25*  CREATININE 1.61*   < > 1.31* 1.54* 1.10*  CALCIUM 9.2   < > 8.7* 8.5* 8.8*  PROT 6.6  --   --   --   --   ALBUMIN 2.9*  --   --   --   --   AST 24  --   --   --   --   ALT 28  --   --   --   --   ALKPHOS 79  --   --   --   --   BILITOT 0.6  --   --   --   --   GFRNONAA 32*   < > 41* 33* 50*  GFRAA 37*   < > 47* 39* 58*  ANIONGAP 10   < > 12 11 9    < > = values in this interval not displayed.     Hematology Recent Labs  Lab 05/05/19 0719 05/06/19 0008 05/07/19 0319  WBC 15.8* 15.1* 14.7*  RBC 3.15* 2.86* 2.98*  HGB 9.2* 8.5* 8.7*  HCT 28.6* 25.8* 26.8*  MCV 90.8 90.2 89.9  MCH 29.2 29.7 29.2  MCHC 32.2 32.9 32.5  RDW 12.2 12.2 12.3  PLT 336 321 318    BNPNo results for input(s): BNP, PROBNP in the last 168 hours.   DDimer No results for input(s): DDIMER in the last 168 hours.   Radiology    No results found.  Cardiac Studies      Patient Profile     73 y.o. female with hx of PAD, HTN,  CHF,  Presents with NSTEMI   Assessment & Plan    1.  CAD :  S/p NSTEMI.  Plan is for cath today .   She is anemic.   This has been discussed  with Dr. Allyson SabalBerry .   Hb appears to be stable She denies having any blood in her stool. She is on  ASA / plavix for her PAD already    2.  CKD:  Mild.  Creatinine is 1.1 today    3.  HTN:  Fairly stable ,  Cont Bisoprolol          For questions or updates, please contact [facility] Please consult [email or web address] for contact info under        Signed, Kristeen MissPhilip Nahser, MD  05/07/2019, 8:54 AM

## 2019-05-08 ENCOUNTER — Encounter (HOSPITAL_COMMUNITY): Payer: Self-pay | Admitting: Cardiovascular Disease

## 2019-05-08 ENCOUNTER — Inpatient Hospital Stay (HOSPITAL_COMMUNITY): Payer: Medicare Other

## 2019-05-08 DIAGNOSIS — E11628 Type 2 diabetes mellitus with other skin complications: Secondary | ICD-10-CM

## 2019-05-08 DIAGNOSIS — N12 Tubulo-interstitial nephritis, not specified as acute or chronic: Secondary | ICD-10-CM

## 2019-05-08 DIAGNOSIS — I739 Peripheral vascular disease, unspecified: Secondary | ICD-10-CM

## 2019-05-08 DIAGNOSIS — Z01810 Encounter for preprocedural cardiovascular examination: Secondary | ICD-10-CM

## 2019-05-08 DIAGNOSIS — I2510 Atherosclerotic heart disease of native coronary artery without angina pectoris: Secondary | ICD-10-CM

## 2019-05-08 DIAGNOSIS — L89899 Pressure ulcer of other site, unspecified stage: Secondary | ICD-10-CM

## 2019-05-08 DIAGNOSIS — F329 Major depressive disorder, single episode, unspecified: Secondary | ICD-10-CM

## 2019-05-08 DIAGNOSIS — Z9889 Other specified postprocedural states: Secondary | ICD-10-CM

## 2019-05-08 LAB — BASIC METABOLIC PANEL
Anion gap: 13 (ref 5–15)
BUN: 21 mg/dL (ref 8–23)
CO2: 18 mmol/L — ABNORMAL LOW (ref 22–32)
Calcium: 8.5 mg/dL — ABNORMAL LOW (ref 8.9–10.3)
Chloride: 105 mmol/L (ref 98–111)
Creatinine, Ser: 1.05 mg/dL — ABNORMAL HIGH (ref 0.44–1.00)
GFR calc Af Amer: >60 mL/min (ref >60)
GFR calc non Af Amer: 53 mL/min — ABNORMAL LOW (ref >60)
Glucose, Bld: 182 mg/dL — ABNORMAL HIGH (ref 70–99)
Potassium: 4.2 mmol/L (ref 3.5–5.1)
Sodium: 136 mmol/L (ref 135–145)

## 2019-05-08 LAB — PROTIME-INR
INR: 1.2 (ref 0.8–1.2)
Prothrombin Time: 15.2 seconds (ref 11.4–15.2)

## 2019-05-08 LAB — PULMONARY FUNCTION TEST
FEF 25-75 Post: 2.2 L/sec
FEF 25-75 Pre: 1.5 L/sec
FEF2575-%Change-Post: 46 %
FEF2575-%Pred-Post: 114 %
FEF2575-%Pred-Pre: 78 %
FEV1-%Change-Post: 10 %
FEV1-%Pred-Post: 81 %
FEV1-%Pred-Pre: 74 %
FEV1-Post: 1.96 L
FEV1-Pre: 1.78 L
FEV1FVC-%Change-Post: 6 %
FEV1FVC-%Pred-Pre: 101 %
FEV6-%Change-Post: 3 %
FEV6-%Pred-Post: 79 %
FEV6-%Pred-Pre: 76 %
FEV6-Post: 2.4 L
FEV6-Pre: 2.32 L
FEV6FVC-%Change-Post: 0 %
FEV6FVC-%Pred-Post: 104 %
FEV6FVC-%Pred-Pre: 104 %
FVC-%Change-Post: 3 %
FVC-%Pred-Post: 76 %
FVC-%Pred-Pre: 73 %
FVC-Post: 2.41 L
FVC-Pre: 2.32 L
Post FEV1/FVC ratio: 81 %
Post FEV6/FVC ratio: 99 %
Pre FEV1/FVC ratio: 77 %
Pre FEV6/FVC Ratio: 100 %

## 2019-05-08 LAB — HEPARIN LEVEL (UNFRACTIONATED): Heparin Unfractionated: 0.52 IU/mL (ref 0.30–0.70)

## 2019-05-08 LAB — SURGICAL PCR SCREEN
MRSA, PCR: NEGATIVE
Staphylococcus aureus: NEGATIVE

## 2019-05-08 LAB — CULTURE, BLOOD (ROUTINE X 2)
Culture: NO GROWTH
Culture: NO GROWTH
Special Requests: ADEQUATE
Special Requests: ADEQUATE

## 2019-05-08 LAB — CBC
HCT: 26.9 % — ABNORMAL LOW (ref 36.0–46.0)
Hemoglobin: 8.6 g/dL — ABNORMAL LOW (ref 12.0–15.0)
MCH: 29.6 pg (ref 26.0–34.0)
MCHC: 32 g/dL (ref 30.0–36.0)
MCV: 92.4 fL (ref 80.0–100.0)
Platelets: 303 10*3/uL (ref 150–400)
RBC: 2.91 MIL/uL — ABNORMAL LOW (ref 3.87–5.11)
RDW: 12.4 % (ref 11.5–15.5)
WBC: 13.6 10*3/uL — ABNORMAL HIGH (ref 4.0–10.5)
nRBC: 0 % (ref 0.0–0.2)

## 2019-05-08 LAB — GLUCOSE, CAPILLARY
Glucose-Capillary: 208 mg/dL — ABNORMAL HIGH (ref 70–99)
Glucose-Capillary: 185 mg/dL — ABNORMAL HIGH (ref 70–99)
Glucose-Capillary: 180 mg/dL — ABNORMAL HIGH (ref 70–99)
Glucose-Capillary: 175 mg/dL — ABNORMAL HIGH (ref 70–99)

## 2019-05-08 LAB — PREPARE RBC (CROSSMATCH)

## 2019-05-08 LAB — SARS CORONAVIRUS 2 BY RT PCR (HOSPITAL ORDER, PERFORMED IN ~~LOC~~ HOSPITAL LAB): SARS Coronavirus 2: NEGATIVE

## 2019-05-08 LAB — HEMOGLOBIN AND HEMATOCRIT, BLOOD
HCT: 26.3 % — ABNORMAL LOW (ref 36.0–46.0)
Hemoglobin: 8.6 g/dL — ABNORMAL LOW (ref 12.0–15.0)

## 2019-05-08 MED ORDER — INSULIN GLARGINE 100 UNIT/ML ~~LOC~~ SOLN
10.0000 [IU] | Freq: Every day | SUBCUTANEOUS | Status: DC
  Filled 2019-05-08: qty 0.1

## 2019-05-08 MED ORDER — SODIUM CHLORIDE 0.9% IV SOLUTION: Freq: Once | INTRAVENOUS | Status: DC

## 2019-05-08 MED ORDER — TRANEXAMIC ACID 1000 MG/10ML IV SOLN
1.5000 mg/kg/h | INTRAVENOUS | Status: DC
  Filled 2019-05-08: qty 25

## 2019-05-08 MED ORDER — INSULIN REGULAR(HUMAN) IN NACL 100-0.9 UT/100ML-% IV SOLN
INTRAVENOUS | Status: DC
  Filled 2019-05-08: qty 100

## 2019-05-08 MED ORDER — DEXMEDETOMIDINE HCL IN NACL 400 MCG/100ML IV SOLN
0.1000 ug/kg/h | INTRAVENOUS | Status: DC
  Filled 2019-05-08: qty 100

## 2019-05-08 MED ORDER — MUPIROCIN 2 % EX OINT
1.0000 "application " | TOPICAL_OINTMENT | Freq: Two times a day (BID) | CUTANEOUS | Status: DC
  Administered 2019-05-09: 1 via NASAL
  Filled 2019-05-08: qty 22

## 2019-05-08 MED ORDER — NITROGLYCERIN IN D5W 200-5 MCG/ML-% IV SOLN
2.0000 ug/min | INTRAVENOUS | Status: DC
  Filled 2019-05-08: qty 250

## 2019-05-08 MED ORDER — TEMAZEPAM 7.5 MG PO CAPS
15.0000 mg | ORAL_CAPSULE | Freq: Once | ORAL | Status: AC | PRN
  Administered 2019-05-08: 15 mg via ORAL
  Filled 2019-05-08: qty 2

## 2019-05-08 MED ORDER — POTASSIUM CHLORIDE 2 MEQ/ML IV SOLN
80.0000 meq | INTRAVENOUS | Status: DC
  Filled 2019-05-08: qty 40

## 2019-05-08 MED ORDER — ANGIOPLASTY BOOK
Freq: Once | Status: AC
  Administered 2019-05-08: 03:00:00
  Filled 2019-05-08: qty 1

## 2019-05-08 MED ORDER — TRANEXAMIC ACID (OHS) PUMP PRIME SOLUTION
2.0000 mg/kg | INTRAVENOUS | Status: DC
  Filled 2019-05-08: qty 1.81

## 2019-05-08 MED ORDER — HEART ATTACK BOUNCING BOOK
Freq: Once | Status: AC
  Administered 2019-05-08: 03:00:00
  Filled 2019-05-08: qty 1

## 2019-05-08 MED ORDER — ALBUTEROL SULFATE (2.5 MG/3ML) 0.083% IN NEBU
2.5000 mg | INHALATION_SOLUTION | Freq: Once | RESPIRATORY_TRACT | Status: AC
  Administered 2019-05-08: 2.5 mg via RESPIRATORY_TRACT

## 2019-05-08 MED ORDER — EPINEPHRINE PF 1 MG/ML IJ SOLN
0.0000 ug/min | INTRAVENOUS | Status: DC
  Filled 2019-05-08: qty 4

## 2019-05-08 MED ORDER — PLASMA-LYTE 148 IV SOLN
INTRAVENOUS | Status: DC
  Filled 2019-05-08: qty 2.5

## 2019-05-08 MED ORDER — CHLORHEXIDINE GLUCONATE CLOTH 2 % EX PADS: 6.0000 | MEDICATED_PAD | Freq: Once | CUTANEOUS | Status: DC

## 2019-05-08 MED ORDER — PHENYLEPHRINE HCL-NACL 20-0.9 MG/250ML-% IV SOLN
30.0000 ug/min | INTRAVENOUS | Status: DC
  Filled 2019-05-08: qty 250

## 2019-05-08 MED ORDER — SODIUM CHLORIDE 0.9 % IV SOLN
INTRAVENOUS | Status: DC
  Filled 2019-05-08: qty 30

## 2019-05-08 MED ORDER — MANNITOL 20 % IV SOLN
INTRAVENOUS | Status: DC
  Filled 2019-05-08: qty 13

## 2019-05-08 MED ORDER — LOSARTAN POTASSIUM 25 MG PO TABS
25.0000 mg | ORAL_TABLET | Freq: Every day | ORAL | Status: DC
  Administered 2019-05-08: 13:00:00 25 mg via ORAL
  Filled 2019-05-08: qty 1

## 2019-05-08 MED ORDER — CHLORHEXIDINE GLUCONATE CLOTH 2 % EX PADS
6.0000 | MEDICATED_PAD | Freq: Once | CUTANEOUS | Status: AC
  Administered 2019-05-08: 6 via TOPICAL

## 2019-05-08 MED ORDER — BISACODYL 5 MG PO TBEC
5.0000 mg | DELAYED_RELEASE_TABLET | Freq: Once | ORAL | Status: DC
  Filled 2019-05-08: qty 1

## 2019-05-08 MED ORDER — TRANEXAMIC ACID (OHS) BOLUS VIA INFUSION
15.0000 mg/kg | INTRAVENOUS | Status: DC
  Filled 2019-05-08: qty 1361

## 2019-05-08 MED ORDER — VANCOMYCIN HCL 10 G IV SOLR
1500.0000 mg | INTRAVENOUS | Status: DC
  Filled 2019-05-08: qty 1500

## 2019-05-08 MED ORDER — LEVOFLOXACIN IN D5W 500 MG/100ML IV SOLN
500.0000 mg | INTRAVENOUS | Status: DC
  Filled 2019-05-08: qty 100

## 2019-05-08 MED ORDER — METOPROLOL TARTRATE 12.5 MG HALF TABLET
12.5000 mg | ORAL_TABLET | Freq: Once | ORAL | Status: AC
  Administered 2019-05-09: 12.5 mg via ORAL
  Filled 2019-05-08: qty 1

## 2019-05-08 MED ORDER — MILRINONE LACTATE IN DEXTROSE 20-5 MG/100ML-% IV SOLN
0.3000 ug/kg/min | INTRAVENOUS | Status: DC
  Filled 2019-05-08: qty 100

## 2019-05-08 MED ORDER — DOPAMINE-DEXTROSE 3.2-5 MG/ML-% IV SOLN
0.0000 ug/kg/min | INTRAVENOUS | Status: DC
  Filled 2019-05-08: qty 250

## 2019-05-08 MED ORDER — TEMAZEPAM 7.5 MG PO CAPS: 15.0000 mg | ORAL_CAPSULE | Freq: Once | ORAL | Status: DC | PRN

## 2019-05-08 MED ORDER — CHLORHEXIDINE GLUCONATE 0.12 % MT SOLN
15.0000 mL | Freq: Once | OROMUCOSAL | Status: AC
  Administered 2019-05-09: 05:00:00 15 mL via OROMUCOSAL
  Filled 2019-05-08: qty 15

## 2019-05-08 NOTE — Progress Notes (Addendum)
Left foot bleed stopped. ABD pad applied and foot and ankle wrapped with kerlix and elevated on pillow.

## 2019-05-08 NOTE — Progress Notes (Addendum)
CV specialist at bedside for carotids. Left lateral foot wound with gross amt of bleeding noted that started after tech completed LE dopplers. Pressure held to left foot bleeding wound. Est blood loss +/- 200 ml. Heparin gtt stopped at this time.

## 2019-05-08 NOTE — Anesthesia Preprocedure Evaluation (Addendum)
Anesthesia Evaluation  Patient identified by MRN, date of birth, ID band Patient awake    Reviewed: Allergy & Precautions, NPO status , Patient's Chart, lab work & pertinent test results  History of Anesthesia Complications Negative for: history of anesthetic complications  Airway Mallampati: II   Neck ROM: Full    Dental  (+) Edentulous Upper, Edentulous Lower   Pulmonary COPD, former smoker,    breath sounds clear to auscultation       Cardiovascular hypertension, Pt. on medications + Past MI and + Peripheral Vascular Disease   Rhythm:Regular Rate:Normal   '20 Cath - Mid RCA lesion is 90% stenosed. Ost Cx to Prox Cx lesion is 95% stenosed. Ost LAD to Prox LAD lesion is 60% stenosed. 2nd Mrg lesion is 90% stenosed. Ramus lesion is 60% stenosed.   '20 TTE - EF 60-65%. There is mildly increased left ventricular wall thickness. Left ventricular diastolic Doppler parameters are consistent with impaired relaxation. LA was moderately dilated. Trivial MR and PR.     Neuro/Psych PSYCHIATRIC DISORDERS Depression negative neurological ROS     GI/Hepatic negative GI ROS, Neg liver ROS,   Endo/Other  diabetes, Type 2, Insulin Dependent Obesity   Renal/GU negative Renal ROS     Musculoskeletal negative musculoskeletal ROS (+)   Abdominal   Peds  Hematology  (+) anemia ,   Anesthesia Other Findings   Reproductive/Obstetrics                           Anesthesia Physical Anesthesia Plan  ASA: IV  Anesthesia Plan: General   Post-op Pain Management:    Induction: Intravenous  PONV Risk Score and Plan: 3 and Treatment may vary due to age or medical condition, Ondansetron, Dexamethasone and Midazolam  Airway Management Planned: Oral ETT  Additional Equipment: Arterial line, CVP, PA Cath, TEE and Ultrasound Guidance Line Placement  Intra-op Plan:   Post-operative Plan: Post-operative  intubation/ventilation  Informed Consent: I have reviewed the patients History and Physical, chart, labs and discussed the procedure including the risks, benefits and alternatives for the proposed anesthesia with the patient or authorized representative who has indicated his/her understanding and acceptance.       Plan Discussed with: CRNA and Anesthesiologist  Anesthesia Plan Comments:        Anesthesia Quick Evaluation

## 2019-05-08 NOTE — Progress Notes (Signed)
  Date: 05/08/2019  Patient name: Stephanie [name]  Medical record number: [id number]  Date of birth: 03/17/1946   I have seen and evaluated this patient and I have discussed the plan of care with the house staff. Please see their note for complete details. I concur with their findings with the following additions/corrections: [name] was seen this morning on team rounds.  CVTS is preparing her for CABG tomorrow.  Bartholomew Crews, MD 05/08/2019, 5:02 PM

## 2019-05-08 NOTE — Progress Notes (Addendum)
     [address]       [place] [address]             [phone]       No events overnight.  P2Y12 316. Plavix non-responder  Vitals:   05/08/19 0030 05/08/19 0500  BP: (!) 151/59 (!) 157/61  Pulse: 72 69  Resp: 17 14  Temp: 99.1 F (37.3 C) 99.5 F (37.5 C)  SpO2: 98% 98%    Alert NAD Sinus, regular EWOB, clear B abd soft Venous stasis changes to BLE.   Clean dressing on left foot    CBC    Component Value Date/Time   WBC 13.6 (H) 05/08/2019 0535   RBC 2.91 (L) 05/08/2019 0535   HGB 8.6 (L) 05/08/2019 0535   HCT 26.9 (L) 05/08/2019 0535   PLT 303 05/08/2019 0535   MCV 92.4 05/08/2019 0535   MCH 29.6 05/08/2019 0535   MCHC 32.0 05/08/2019 0535   RDW 12.4 05/08/2019 0535   LYMPHSABS 1.8 05/02/2019 2332   MONOABS 1.3 (H) 05/02/2019 2332   EOSABS 0.2 05/02/2019 2332   BASOSABS 0.0 05/02/2019 2332   BMP Latest Ref Rng & Units 05/08/2019 05/07/2019 05/06/2019  Glucose 70 - 99 mg/dL 182(H) 178(H) 173(H)  BUN 8 - 23 mg/dL 21 25(H) 31(H)  Creatinine 0.44 - 1.00 mg/dL 1.05(H) 1.10(H) 1.54(H)  Sodium 135 - 145 mmol/L 136 136 133(L)  Potassium 3.5 - 5.1 mmol/L 4.2 3.9 4.0  Chloride 98 - 111 mmol/L 105 107 100  CO2 22 - 32 mmol/L 18(L) 20(L) 22  Calcium 8.9 - 10.3 mg/dL 8.5(L) 8.8(L) 8.5(L)     NSTEMI with 3V CAD, Plavix non-responder   Will prep for surgery for 7/2 Will hold plavix Will order CXR Hgb 8.6, so will give 1 unit of blood pre-op Awaiting lower extremity vein mapping.

## 2019-05-08 NOTE — Progress Notes (Signed)
ANTICOAGULATION CONSULT NOTE - Follow Up Consult  Pharmacy Consult for Heparin Indication: 3vCAD awaiting CABG   Patient Measurements: Height: 5\' 6"  (167.6 cm) Weight: 199 lb 15.3 oz (90.7 kg) IBW/kg (Calculated) : 59.3 Heparin Dosing Weight: 79.4 kg  Vital Signs: Temp: 99.7 F (37.6 C) (07/01 0844) Temp Source: Oral (07/01 0844) BP: 139/47 (07/01 0844) Pulse Rate: 65 (07/01 0844)  Labs: Recent Labs    05/06/19 0008 05/06/19 0823 05/07/19 0319 05/08/19 0535  HGB 8.5*  --  8.7* 8.6*  HCT 25.8*  --  26.8* 26.9*  PLT 321  --  318 303  HEPARINUNFRC 0.35 0.48 0.54 0.52  CREATININE 1.54*  --  1.10* 1.05*    Estimated Creatinine Clearance: 55 mL/min (A) (by C-G formula based on SCr of 1.05 mg/dL (H)).   Assessment: 61 YOF on Heparin for ACS now s/p cath 6/30 which showed 3vCAD with plans to consult CVTS for CABG. Pharmacy consulted to resume Heparin 6 hours after sheath removal.  Heparin level therapeutic  CABG planned for 7/2  Goal of Therapy:  Heparin level 0.3-0.7 units/ml Monitor platelets by anticoagulation protocol: Yes  Plan:  Continue heparin at 1900 units / hr Follow up after CABG 7/2  Thank you Anette Guarneri, PharmD [phone] 05/08/2019 8:55 AM   **Pharmacist phone directory can now be found on [email or web address] ([email or web address]).  Listed under [place].

## 2019-05-08 NOTE — Progress Notes (Signed)
1400-1440 Came to see pt to discuss importance of mobility and IS after surgery. Pt stated she was walking with walker or cane prior to hospitalization. Did not walk with pt as she had bleed from left foot earlier and is bandaged. Gave pt OHS booklet, care guide and in the tube handout. Discussed keeping arms close to the body. Gave IS and pt demonstrated 1000 ml correctly with cues. Wrote down how to view pre op video and offered to put on now. Pt eating lunch and not ready to watch. Encouraged her to get staff to assist with video later. Pt stated she will be going back to [facility] after discharge. Graylon Good RN BSN 05/08/2019 2:43 PM

## 2019-05-08 NOTE — Progress Notes (Addendum)
Progress Note  Patient Name: Stephanie [name] Stephanie [name] Date of Encounter: 05/08/2019  Primary Cardiologist: Donato SchultzMark Skains, MD   Subjective   Patient reports feeling fine this morning. No recurrent chest pain or SOB. She is anticipating CABG tomorrow morning. She expresses concern for pain following surgery.   Inpatient Medications    Scheduled Meds: . sodium chloride   Intravenous Once  . aspirin  81 mg Oral Daily  . bisacodyl  5 mg Oral Once  . bisoprolol  2.5 mg Oral Daily  . [START ON 05/09/2019] chlorhexidine  15 mL Mouth/Throat Once  . Chlorhexidine Gluconate Cloth  6 each Topical Once   And  . Chlorhexidine Gluconate Cloth  6 each Topical Once  . collagenase   Topical Daily  . famotidine  10 mg Oral Daily  . insulin aspart  0-12 Units Subcutaneous TID WC  . insulin aspart  0-5 Units Subcutaneous QHS  . insulin aspart  3 Units Subcutaneous TID WC  . insulin glargine  20 Units Subcutaneous QHS  . linagliptin  5 mg Oral Daily  . [START ON 05/09/2019] metoprolol tartrate  12.5 mg Oral Once  . PARoxetine  75 mg Oral Daily  . polyethylene glycol  17 g Oral Daily  . rosuvastatin  10 mg Oral QHS  . senna-docusate  1 tablet Oral QHS  . sodium chloride flush  3 mL Intravenous Q12H  . topiramate  25 mg Oral QHS   Continuous Infusions: . sodium chloride    . cefTRIAXone (ROCEPHIN)  IV 1 g (05/06/19 0910)  . heparin 1,900 Units/hr (05/08/19 0917)   PRN Meds: sodium chloride, acetaminophen **OR** acetaminophen, acetaminophen, gabapentin, ipratropium-albuterol, morphine injection, ondansetron (ZOFRAN) IV, sodium chloride flush, temazepam   Vital Signs    Vitals:   05/07/19 2035 05/08/19 0030 05/08/19 0500 05/08/19 0844  BP:  (!) 151/59 (!) 157/61 (!) 139/47  Pulse: 64 72 69 65  Resp: 17 17 14 17   Temp: 98.2 F (36.8 C) 99.1 F (37.3 C) 99.5 F (37.5 C) 99.7 F (37.6 C)  TempSrc: Oral Oral Oral Oral  SpO2: 96% 98% 98% 95%  Weight:   90.7 kg   Height:        Intake/Output  Summary (Last 24 hours) at 05/08/2019 0955 Last data filed at 05/08/2019 0600 Gross per 24 hour  Intake 2508.72 ml  Output 100 ml  Net 2408.72 ml   Filed Weights   05/07/19 0427 05/07/19 0454 05/08/19 0500  Weight: 91.6 kg 91.7 kg 90.7 kg    Telemetry    Sinus rhythm with occasional PVCs - Personally Reviewed  Physical Exam   GEN: Sitting upright o acute distress.   Neck: No JVD, no carotid bruits Cardiac:  RRR, no murmurs, rubs, or gallops.  Respiratory: Clear to auscultation bilaterally, no wheezes/ rales/ rhonchi GI: NABS, Soft, obese, nontender, non-distended  MS: No edema; No deformity.LLE wrapped in gauze dressing Neuro:  Nonfocal, moving all extremities spontaneously Psych: Normal affect   Labs    Chemistry Recent Labs  Lab 05/02/19 2332  05/06/19 0008 05/07/19 0319 05/08/19 0535  NA 133*   < > 133* 136 136  K 4.1   < > 4.0 3.9 4.2  CL 102   < > 100 107 105  CO2 21*   < > 22 20* 18*  GLUCOSE 165*   < > 173* 178* 182*  BUN 30*   < > 31* 25* 21  CREATININE 1.61*   < > 1.54* 1.10* 1.05*  CALCIUM 9.2   < > 8.5* 8.8* 8.5*  PROT 6.6  --   --   --   --   ALBUMIN 2.9*  --   --   --   --   AST 24  --   --   --   --   ALT 28  --   --   --   --   ALKPHOS 79  --   --   --   --   BILITOT 0.6  --   --   --   --   GFRNONAA 32*   < > 33* 50* 53*  GFRAA 37*   < > 39* 58* >60  ANIONGAP 10   < > 11 9 13    < > = values in this interval not displayed.     Hematology Recent Labs  Lab 05/06/19 0008 05/07/19 0319 05/08/19 0535  WBC 15.1* 14.7* 13.6*  RBC 2.86* 2.98* 2.91*  HGB 8.5* 8.7* 8.6*  HCT 25.8* 26.8* 26.9*  MCV 90.2 89.9 92.4  MCH 29.7 29.2 29.6  MCHC 32.9 32.5 32.0  RDW 12.2 12.3 12.4  PLT 321 318 303    Cardiac EnzymesNo results for input(s): TROPONINI in the last 168 hours. No results for input(s): TROPIPOC in the last 168 hours.   BNPNo results for input(s): BNP, PROBNP in the last 168 hours.   DDimer No results for input(s): DDIMER in the last 168  hours.   Radiology    X-ray Chest Pa Or Ap  Result Date: 05/08/2019 CLINICAL DATA:  Non ST-elevation myocardial infarction EXAM: CHEST  1 VIEW COMPARISON:  05/02/2019 FINDINGS: Generous heart size, stable. Negative mediastinal contours. There is no edema, consolidation, effusion, or pneumothorax. IMPRESSION: No evidence of active disease. Electronically Signed   By: Marnee SpringJonathon  Watts M.D.   On: 05/08/2019 09:26    Cardiac Studies   Left heart catheterization 05/07/2019:  Mid RCA lesion is 90% stenosed.  Ost Cx to Prox Cx lesion is 95% stenosed.  Ost LAD to Prox LAD lesion is 60% stenosed.  2nd Mrg lesion is 90% stenosed.  Ramus lesion is 60% stenosed. IMPRESSION: [name] was admitted with heart failure with preserved LV function.  Troponins were fairly low and flat.  She is diabetic.  Coronary coronary angiography revealed three-vessel disease with high-grade ostial nondominant circumflex not amenable to PCI, high-grade mid dominant RCA stenosis and moderate segmental proximal LAD disease with positive FFR of 0.79.  Have reviewed with Dr. Eldridge DaceVaranasi and discussed with her attending, Dr. Elease HashimotoNahser.  We will get T CTS consult for revascularization.  The radial sheath was removed and a TR band was placed on the right wrist to achieve patent hemostasis.  The femoral sheath was secured in place.  Patient Profile     73 y.o. female with hx of PAD, HTN, chronic diastolic CHF, DM type 2, and COPD, who presents with NSTEMI.   Assessment & Plan    1. Multivessel CAD: patient presented with SOB. Found to have elevated troponins (peak 755) and TWI in lateral leads. Echo showed EF 60-65%, G1DD, no RWMA, no significant valvular abnormalities. LHC 05/07/2019 with multivessel CAD. She was recommended for TCTS consult for revascularization. Tentatively scheduled for CABG later this week.  - Continue aspirin and statin - Continue bisoprolol - Plavix on hold for CABG  - Continue heparin gtt  2. Chronic  diastolic CHF: Echo this admission with EF 60-65% and G1DD. She appears euvolemic and has not needed diuresis  this admission.  - Continue bisoprolol - Continue to monitor volume status closely in the perioperative setting  3. HTN: BP elevated this morning. Home hydralazine (100mg  TID) held on admission, as well as lisinopril ?40mg  BID.  - Continue bisoprolol - Will start losartan 25mg  daily  4. HLD: LDL 37; at goal of <70 - Continue statin  For questions or updates, please contact [place] Please consult [email or web address] for contact info under Cardiology/STEMI.      Signed, Abigail Butts, PA-C  05/08/2019, 9:55 AM   [phone]   Attending Note:   The patient was seen and examined.  Agree with assessment and plan as noted above.  Changes made to the above note as needed.  Patient seen and independently examined with Roby Lofts, PA .   We discussed all aspects of the encounter. I agree with the assessment and plan as stated above.  1.  CAD :   For CABG tomorrow .   P2Y12 is normal  She is somewhat anxious .   2.  Chronic diastolic chf:   Appears to be euvolumic   3.  HTN:   Continue meds.      I have spent a total of 40 minutes with patient reviewing hospital  notes , telemetry, EKGs, labs and examining patient as well as establishing an assessment and plan that was discussed with the patient. > 50% of time was spent in direct patient care.    Thayer Headings, Brooke Bonito., MD, [facility] 05/08/2019, 10:33 AM [address],  [place] Pager [phone]

## 2019-05-08 NOTE — Progress Notes (Signed)
   Subjective: Pt was doing well today. She reports no pain or SOB. She is anxious about her surgery tomorrow, specifically about the pain. She has no other concerns.   Objective:  Vital signs in last 24 hours: Vitals:   05/07/19 1948 05/07/19 2035 05/08/19 0030 05/08/19 0500  BP: (!) 154/69  (!) 151/59 (!) 157/61  Pulse: 64 64 72 69  Resp: 16 17 17 14   Temp: 98.2 F (36.8 C) 98.2 F (36.8 C) 99.1 F (37.3 C) 99.5 F (37.5 C)  TempSrc:  Oral Oral Oral  SpO2: 98% 96% 98% 98%  Weight:    90.7 kg  Height:       Physical Exam  Constitutional: No distress.  HENT:  Head: Normocephalic and atraumatic.  Cardiovascular: Normal rate, regular rhythm and normal heart sounds.  No murmur heard. No lower extremity edema  Pulmonary/Chest: Effort normal and breath sounds normal. No respiratory distress.  Skin: Skin is warm and dry. She is not diaphoretic.  Bandage over left foot 2/2 to diabetic wound which she had prior to admission; venous statis bilaterally   Nursing note and vitals reviewed.   Assessment/Plan:  Principal Problem:   NSTEMI (non-ST elevated myocardial infarction) ([place]) -came in with SOB, found to have elevated troponins and BNP and after diuresed underwent cath showing 3VD and recommended for CABG which is scheduled tomorrow -medically optimized on heparin and aspirin, plavix on hold for surgery per cards and CTS, continue bisoprolol and statin -undergoing blood transfusion in preparation for surgery  -NPO tonight in preparation for surgery and long acting insulin dc'ed in preparation for surgery   Active Problems:    Diabetes mellitus without complication (HCC) -Glucose in the 170s -on sliding scale insulin, long acting dc'ed in anticipation of surgery     HTN (hypertension) -continue bisoprolol -start losartan 25 mg daily     Pressure injury of skin -no increased pain at site of injury, continue wound care by nursing   Pyelonephritis/AKI  -afebrile,  leukocytosis downtrending, creatinine downtrending, no pain with urination  -receiving ceftriaxone for a total 2 week course    COPD (chronic obstructive pulmonary disease) ([place])   Depression   Acute on chronic respiratory failure with hypoxia ([place])   Acute hypoxemic respiratory failure ([place])  Dispo: Anticipated discharge once stable following CABG.  Al Decant, MD 05/08/2019, 5:57 AM Pager: [phone]

## 2019-05-08 NOTE — Progress Notes (Signed)
Lower extremity saphenous mapping and pre cabg has been completed.   Preliminary results in CV Proc.   Abram Sander 05/08/2019 3:11 PM

## 2019-05-09 ENCOUNTER — Inpatient Hospital Stay (HOSPITAL_COMMUNITY): Payer: Medicare Other

## 2019-05-09 ENCOUNTER — Encounter (HOSPITAL_COMMUNITY)
Admission: AD | Disposition: A | Discharge status: Skilled Nursing Facility | Payer: Self-pay | Source: Other Acute Inpatient Hospital | Attending: Thoracic Surgery (Cardiothoracic Vascular Surgery)

## 2019-05-09 ENCOUNTER — Inpatient Hospital Stay (HOSPITAL_COMMUNITY): Payer: Medicare Other | Admitting: Anesthesiology

## 2019-05-09 DIAGNOSIS — I25110 Atherosclerotic heart disease of native coronary artery with unstable angina pectoris: Secondary | ICD-10-CM

## 2019-05-09 DIAGNOSIS — I214 Non-ST elevation (NSTEMI) myocardial infarction: Secondary | ICD-10-CM

## 2019-05-09 DIAGNOSIS — I2510 Atherosclerotic heart disease of native coronary artery without angina pectoris: Secondary | ICD-10-CM

## 2019-05-09 DIAGNOSIS — Z951 Presence of aortocoronary bypass graft: Secondary | ICD-10-CM

## 2019-05-09 HISTORY — PX: TEE WITHOUT CARDIOVERSION: SHX5443

## 2019-05-09 HISTORY — PX: CORONARY ARTERY BYPASS GRAFT: SHX141

## 2019-05-09 LAB — BASIC METABOLIC PANEL
Anion gap: 6 (ref 5–15)
BUN: 19 mg/dL (ref 8–23)
CO2: 19 mmol/L — ABNORMAL LOW (ref 22–32)
Calcium: 7.7 mg/dL — ABNORMAL LOW (ref 8.9–10.3)
Chloride: 110 mmol/L (ref 98–111)
Creatinine, Ser: 1 mg/dL (ref 0.44–1.00)
GFR calc Af Amer: >60 mL/min (ref >60)
GFR calc non Af Amer: 56 mL/min — ABNORMAL LOW (ref >60)
Glucose, Bld: 129 mg/dL — ABNORMAL HIGH (ref 70–99)
Potassium: 4.9 mmol/L (ref 3.5–5.1)
Sodium: 135 mmol/L (ref 135–145)

## 2019-05-09 LAB — GLUCOSE, CAPILLARY
Glucose-Capillary: 219 mg/dL — ABNORMAL HIGH (ref 70–99)
Glucose-Capillary: 107 mg/dL — ABNORMAL HIGH (ref 70–99)
Glucose-Capillary: 100 mg/dL — ABNORMAL HIGH (ref 70–99)
Glucose-Capillary: 89 mg/dL (ref 70–99)
Glucose-Capillary: 92 mg/dL (ref 70–99)
Glucose-Capillary: 97 mg/dL (ref 70–99)
Glucose-Capillary: 121 mg/dL — ABNORMAL HIGH (ref 70–99)
Glucose-Capillary: 140 mg/dL — ABNORMAL HIGH (ref 70–99)
Glucose-Capillary: 133 mg/dL — ABNORMAL HIGH (ref 70–99)

## 2019-05-09 LAB — CBC WITH DIFFERENTIAL/PLATELET
Abs Immature Granulocytes: 0.61 10*3/uL — ABNORMAL HIGH (ref 0.00–0.07)
Basophils Absolute: 0.1 10*3/uL (ref 0.0–0.1)
Basophils Relative: 0 %
Eosinophils Absolute: 0.1 10*3/uL (ref 0.0–0.5)
Eosinophils Relative: 0 %
HCT: 26.9 % — ABNORMAL LOW (ref 36.0–46.0)
Hemoglobin: 9 g/dL — ABNORMAL LOW (ref 12.0–15.0)
Immature Granulocytes: 3 %
Lymphocytes Relative: 6 %
Lymphs Abs: 1.5 10*3/uL (ref 0.7–4.0)
MCH: 30.2 pg (ref 26.0–34.0)
MCHC: 33.5 g/dL (ref 30.0–36.0)
MCV: 90.3 fL (ref 80.0–100.0)
Monocytes Absolute: 1.2 10*3/uL — ABNORMAL HIGH (ref 0.1–1.0)
Monocytes Relative: 5 %
Neutro Abs: 20.2 10*3/uL — ABNORMAL HIGH (ref 1.7–7.7)
Neutrophils Relative %: 86 %
Platelets: 234 10*3/uL (ref 150–400)
RBC: 2.98 MIL/uL — ABNORMAL LOW (ref 3.87–5.11)
RDW: 13.7 % (ref 11.5–15.5)
WBC: 23.7 10*3/uL — ABNORMAL HIGH (ref 4.0–10.5)
nRBC: 0 % (ref 0.0–0.2)

## 2019-05-09 LAB — POCT I-STAT 7, (LYTES, BLD GAS, ICA,H+H)
Acid-base deficit: 4 mmol/L — ABNORMAL HIGH (ref 0.0–2.0)
Acid-base deficit: 6 mmol/L — ABNORMAL HIGH (ref 0.0–2.0)
Acid-base deficit: 6 mmol/L — ABNORMAL HIGH (ref 0.0–2.0)
Acid-base deficit: 7 mmol/L — ABNORMAL HIGH (ref 0.0–2.0)
Acid-base deficit: 7 mmol/L — ABNORMAL HIGH (ref 0.0–2.0)
Bicarbonate: 19.1 mmol/L — ABNORMAL LOW (ref 20.0–28.0)
Bicarbonate: 19.2 mmol/L — ABNORMAL LOW (ref 20.0–28.0)
Bicarbonate: 19.2 mmol/L — ABNORMAL LOW (ref 20.0–28.0)
Bicarbonate: 20.1 mmol/L (ref 20.0–28.0)
Bicarbonate: 20.5 mmol/L (ref 20.0–28.0)
Calcium, Ion: 1.12 mmol/L — ABNORMAL LOW (ref 1.15–1.40)
Calcium, Ion: 1.21 mmol/L (ref 1.15–1.40)
Calcium, Ion: 1.22 mmol/L (ref 1.15–1.40)
Calcium, Ion: 1.27 mmol/L (ref 1.15–1.40)
Calcium, Ion: 1.28 mmol/L (ref 1.15–1.40)
HCT: 22 % — ABNORMAL LOW (ref 36.0–46.0)
HCT: 23 % — ABNORMAL LOW (ref 36.0–46.0)
HCT: 23 % — ABNORMAL LOW (ref 36.0–46.0)
HCT: 26 % — ABNORMAL LOW (ref 36.0–46.0)
HCT: 27 % — ABNORMAL LOW (ref 36.0–46.0)
Hemoglobin: 7.5 g/dL — ABNORMAL LOW (ref 12.0–15.0)
Hemoglobin: 7.8 g/dL — ABNORMAL LOW (ref 12.0–15.0)
Hemoglobin: 7.8 g/dL — ABNORMAL LOW (ref 12.0–15.0)
Hemoglobin: 8.8 g/dL — ABNORMAL LOW (ref 12.0–15.0)
Hemoglobin: 9.2 g/dL — ABNORMAL LOW (ref 12.0–15.0)
O2 Saturation: 100 %
O2 Saturation: 95 %
O2 Saturation: 97 %
O2 Saturation: 98 %
O2 Saturation: 99 %
Patient temperature: 35.9
Patient temperature: 36.6
Patient temperature: 37.4
Potassium: 4.2 mmol/L (ref 3.5–5.1)
Potassium: 4.3 mmol/L (ref 3.5–5.1)
Potassium: 4.6 mmol/L (ref 3.5–5.1)
Potassium: 4.7 mmol/L (ref 3.5–5.1)
Potassium: 4.8 mmol/L (ref 3.5–5.1)
Sodium: 135 mmol/L (ref 135–145)
Sodium: 137 mmol/L (ref 135–145)
Sodium: 137 mmol/L (ref 135–145)
Sodium: 138 mmol/L (ref 135–145)
Sodium: 139 mmol/L (ref 135–145)
TCO2: 20 mmol/L — ABNORMAL LOW (ref 22–32)
TCO2: 20 mmol/L — ABNORMAL LOW (ref 22–32)
TCO2: 20 mmol/L — ABNORMAL LOW (ref 22–32)
TCO2: 22 mmol/L (ref 22–32)
TCO2: 22 mmol/L (ref 22–32)
pCO2 arterial: 35.4 mmHg (ref 32.0–48.0)
pCO2 arterial: 35.6 mmHg (ref 32.0–48.0)
pCO2 arterial: 36.5 mmHg (ref 32.0–48.0)
pCO2 arterial: 38.8 mmHg (ref 32.0–48.0)
pCO2 arterial: 45.3 mmHg (ref 32.0–48.0)
pH, Arterial: 7.25 — ABNORMAL LOW (ref 7.350–7.450)
pH, Arterial: 7.301 — ABNORMAL LOW (ref 7.350–7.450)
pH, Arterial: 7.328 — ABNORMAL LOW (ref 7.350–7.450)
pH, Arterial: 7.343 — ABNORMAL LOW (ref 7.350–7.450)
pH, Arterial: 7.368 (ref 7.350–7.450)
pO2, Arterial: 104 mmHg (ref 83.0–108.0)
pO2, Arterial: 105 mmHg (ref 83.0–108.0)
pO2, Arterial: 133 mmHg — ABNORMAL HIGH (ref 83.0–108.0)
pO2, Arterial: 275 mmHg — ABNORMAL HIGH (ref 83.0–108.0)
pO2, Arterial: 82 mmHg — ABNORMAL LOW (ref 83.0–108.0)

## 2019-05-09 LAB — BLOOD GAS, ARTERIAL
Acid-base deficit: 1.8 mmol/L (ref 0.0–2.0)
Bicarbonate: 22 mmol/L (ref 20.0–28.0)
Drawn by: 51147
O2 Saturation: 96.3 %
Patient temperature: 98.7
pCO2 arterial: 34.5 mmHg (ref 32.0–48.0)
pH, Arterial: 7.422 (ref 7.350–7.450)
pO2, Arterial: 82.8 mmHg — ABNORMAL LOW (ref 83.0–108.0)

## 2019-05-09 LAB — CBC
HCT: 24.9 % — ABNORMAL LOW (ref 36.0–46.0)
Hemoglobin: 8.2 g/dL — ABNORMAL LOW (ref 12.0–15.0)
MCH: 30.4 pg (ref 26.0–34.0)
MCHC: 32.9 g/dL (ref 30.0–36.0)
MCV: 92.2 fL (ref 80.0–100.0)
Platelets: 165 10*3/uL (ref 150–400)
RBC: 2.7 MIL/uL — ABNORMAL LOW (ref 3.87–5.11)
RDW: 13.2 % (ref 11.5–15.5)
WBC: 24.1 10*3/uL — ABNORMAL HIGH (ref 4.0–10.5)
nRBC: 0 % (ref 0.0–0.2)

## 2019-05-09 LAB — POCT I-STAT 4, (NA,K, GLUC, HGB,HCT)
Glucose, Bld: 206 mg/dL — ABNORMAL HIGH (ref 70–99)
Glucose, Bld: 216 mg/dL — ABNORMAL HIGH (ref 70–99)
Glucose, Bld: 187 mg/dL — ABNORMAL HIGH (ref 70–99)
Glucose, Bld: 163 mg/dL — ABNORMAL HIGH (ref 70–99)
Glucose, Bld: 157 mg/dL — ABNORMAL HIGH (ref 70–99)
Glucose, Bld: 116 mg/dL — ABNORMAL HIGH (ref 70–99)
HCT: 24 % — ABNORMAL LOW (ref 36.0–46.0)
HCT: 20 % — ABNORMAL LOW (ref 36.0–46.0)
HCT: 24 % — ABNORMAL LOW (ref 36.0–46.0)
HCT: 20 % — ABNORMAL LOW (ref 36.0–46.0)
HCT: 20 % — ABNORMAL LOW (ref 36.0–46.0)
HCT: 21 % — ABNORMAL LOW (ref 36.0–46.0)
Hemoglobin: 8.2 g/dL — ABNORMAL LOW (ref 12.0–15.0)
Hemoglobin: 6.8 g/dL — CL (ref 12.0–15.0)
Hemoglobin: 8.2 g/dL — ABNORMAL LOW (ref 12.0–15.0)
Hemoglobin: 6.8 g/dL — CL (ref 12.0–15.0)
Hemoglobin: 6.8 g/dL — CL (ref 12.0–15.0)
Hemoglobin: 7.1 g/dL — ABNORMAL LOW (ref 12.0–15.0)
Potassium: 3.9 mmol/L (ref 3.5–5.1)
Potassium: 4.2 mmol/L (ref 3.5–5.1)
Potassium: 4.8 mmol/L (ref 3.5–5.1)
Potassium: 4.6 mmol/L (ref 3.5–5.1)
Potassium: 4.5 mmol/L (ref 3.5–5.1)
Potassium: 4.2 mmol/L (ref 3.5–5.1)
Sodium: 136 mmol/L (ref 135–145)
Sodium: 135 mmol/L (ref 135–145)
Sodium: 133 mmol/L — ABNORMAL LOW (ref 135–145)
Sodium: 136 mmol/L (ref 135–145)
Sodium: 137 mmol/L (ref 135–145)
Sodium: 139 mmol/L (ref 135–145)

## 2019-05-09 LAB — HEMOGLOBIN AND HEMATOCRIT, BLOOD
HCT: 19.9 % — ABNORMAL LOW (ref 36.0–46.0)
Hemoglobin: 6.6 g/dL — CL (ref 12.0–15.0)

## 2019-05-09 LAB — ECHO INTRAOPERATIVE TEE
Height: 66 in
Weight: 3178.15 oz

## 2019-05-09 LAB — APTT: aPTT: 49 seconds — ABNORMAL HIGH (ref 24–36)

## 2019-05-09 LAB — PROTIME-INR
INR: 1.6 — ABNORMAL HIGH (ref 0.8–1.2)
Prothrombin Time: 18.8 seconds — ABNORMAL HIGH (ref 11.4–15.2)

## 2019-05-09 LAB — MAGNESIUM: Magnesium: 3.2 mg/dL — ABNORMAL HIGH (ref 1.7–2.4)

## 2019-05-09 LAB — PLATELET COUNT: Platelets: 178 10*3/uL (ref 150–400)

## 2019-05-09 SURGERY — CORONARY ARTERY BYPASS GRAFTING (CABG)
Anesthesia: General | Site: Chest

## 2019-05-09 MED ORDER — TRANEXAMIC ACID 1000 MG/10ML IV SOLN
1.5000 mg/kg/h | INTRAVENOUS | Status: AC
  Administered 2019-05-09: 1.5 mg/kg/h via INTRAVENOUS
  Filled 2019-05-09: qty 25

## 2019-05-09 MED ORDER — LACTATED RINGERS IV SOLN: INTRAVENOUS | Status: DC

## 2019-05-09 MED ORDER — ALBUMIN HUMAN 5 % IV SOLN
250.0000 mL | INTRAVENOUS | Status: AC | PRN
  Administered 2019-05-09 (×3): 12.5 g via INTRAVENOUS
  Filled 2019-05-09: qty 500

## 2019-05-09 MED ORDER — ACETAMINOPHEN 650 MG RE SUPP
650.0000 mg | Freq: Once | RECTAL | Status: AC
  Administered 2019-05-09: 15:00:00 650 mg via RECTAL

## 2019-05-09 MED ORDER — LACTATED RINGERS IV SOLN
500.0000 mL | Freq: Once | INTRAVENOUS | Status: AC | PRN
  Administered 2019-05-09: 500 mL via INTRAVENOUS

## 2019-05-09 MED ORDER — DEXMEDETOMIDINE HCL IN NACL 200 MCG/50ML IV SOLN
0.0000 ug/kg/h | INTRAVENOUS | Status: DC
  Administered 2019-05-09: 0.7 ug/kg/h via INTRAVENOUS
  Filled 2019-05-09: qty 50

## 2019-05-09 MED ORDER — LEVOFLOXACIN IN D5W 500 MG/100ML IV SOLN
500.0000 mg | INTRAVENOUS | Status: AC
  Administered 2019-05-09: 09:00:00 500 mg via INTRAVENOUS
  Filled 2019-05-09: qty 100

## 2019-05-09 MED ORDER — PLASMA-LYTE 148 IV SOLN
INTRAVENOUS | Status: DC | PRN
  Administered 2019-05-09: 500 mL via INTRAVASCULAR

## 2019-05-09 MED ORDER — SODIUM CHLORIDE 0.9 % IV SOLN
INTRAVENOUS | Status: DC | PRN
  Administered 2019-05-09: 13:00:00 via INTRAVENOUS

## 2019-05-09 MED ORDER — PROPOFOL 10 MG/ML IV BOLUS
INTRAVENOUS | Status: AC
  Filled 2019-05-09: qty 20

## 2019-05-09 MED ORDER — PROTAMINE SULFATE 10 MG/ML IV SOLN
INTRAVENOUS | Status: AC
  Filled 2019-05-09: qty 25

## 2019-05-09 MED ORDER — PROPOFOL 10 MG/ML IV BOLUS
INTRAVENOUS | Status: DC | PRN
  Administered 2019-05-09: 40 mg via INTRAVENOUS

## 2019-05-09 MED ORDER — ARTIFICIAL TEARS OPHTHALMIC OINT
TOPICAL_OINTMENT | OPHTHALMIC | Status: DC | PRN
  Administered 2019-05-09: 1 via OPHTHALMIC

## 2019-05-09 MED ORDER — DEXMEDETOMIDINE HCL IN NACL 400 MCG/100ML IV SOLN
0.1000 ug/kg/h | INTRAVENOUS | Status: AC
  Administered 2019-05-09: 10:00:00 0.4 ug/kg/h via INTRAVENOUS
  Filled 2019-05-09: qty 100

## 2019-05-09 MED ORDER — ORAL CARE MOUTH RINSE
15.0000 mL | Freq: Two times a day (BID) | OROMUCOSAL | Status: DC
  Administered 2019-05-09 – 2019-05-14 (×9): 15 mL via OROMUCOSAL

## 2019-05-09 MED ORDER — ALBUMIN HUMAN 5 % IV SOLN
INTRAVENOUS | Status: DC | PRN
  Administered 2019-05-09 (×2): via INTRAVENOUS

## 2019-05-09 MED ORDER — ASPIRIN EC 325 MG PO TBEC
325.0000 mg | DELAYED_RELEASE_TABLET | Freq: Every day | ORAL | Status: DC
  Administered 2019-05-10 – 2019-05-15 (×6): 325 mg via ORAL
  Filled 2019-05-09 (×6): qty 1

## 2019-05-09 MED ORDER — PHENYLEPHRINE 40 MCG/ML (10ML) SYRINGE FOR IV PUSH (FOR BLOOD PRESSURE SUPPORT)
PREFILLED_SYRINGE | INTRAVENOUS | Status: AC
  Filled 2019-05-09: qty 10

## 2019-05-09 MED ORDER — INSULIN REGULAR(HUMAN) IN NACL 100-0.9 UT/100ML-% IV SOLN
INTRAVENOUS | Status: AC
  Administered 2019-05-09: 09:00:00 2.9 [IU]/h via INTRAVENOUS
  Filled 2019-05-09: qty 100

## 2019-05-09 MED ORDER — POTASSIUM CHLORIDE 2 MEQ/ML IV SOLN
80.0000 meq | INTRAVENOUS | Status: DC
  Filled 2019-05-09: qty 40

## 2019-05-09 MED ORDER — POTASSIUM CHLORIDE 10 MEQ/50ML IV SOLN: 10.0000 meq | INTRAVENOUS | Status: AC

## 2019-05-09 MED ORDER — METOPROLOL TARTRATE 12.5 MG HALF TABLET
12.5000 mg | ORAL_TABLET | Freq: Two times a day (BID) | ORAL | Status: DC
  Administered 2019-05-10 – 2019-05-12 (×6): 12.5 mg via ORAL
  Filled 2019-05-09 (×6): qty 1

## 2019-05-09 MED ORDER — ARTIFICIAL TEARS OPHTHALMIC OINT
TOPICAL_OINTMENT | OPHTHALMIC | Status: AC
  Filled 2019-05-09: qty 3.5

## 2019-05-09 MED ORDER — PHENYLEPHRINE HCL-NACL 20-0.9 MG/250ML-% IV SOLN
30.0000 ug/min | INTRAVENOUS | Status: AC
  Administered 2019-05-09: 20 ug/min via INTRAVENOUS
  Filled 2019-05-09: qty 250

## 2019-05-09 MED ORDER — ORAL CARE MOUTH RINSE: 15.0000 mL | OROMUCOSAL | Status: DC

## 2019-05-09 MED ORDER — ROCURONIUM BROMIDE 10 MG/ML (PF) SYRINGE
PREFILLED_SYRINGE | INTRAVENOUS | Status: DC | PRN
  Administered 2019-05-09: 30 mg via INTRAVENOUS
  Administered 2019-05-09: 20 mg via INTRAVENOUS
  Administered 2019-05-09: 50 mg via INTRAVENOUS
  Administered 2019-05-09: 100 mg via INTRAVENOUS

## 2019-05-09 MED ORDER — ROCURONIUM BROMIDE 10 MG/ML (PF) SYRINGE
PREFILLED_SYRINGE | INTRAVENOUS | Status: AC
  Filled 2019-05-09: qty 10

## 2019-05-09 MED ORDER — FENTANYL CITRATE (PF) 250 MCG/5ML IJ SOLN
INTRAMUSCULAR | Status: AC
  Filled 2019-05-09: qty 30

## 2019-05-09 MED ORDER — PROTAMINE SULFATE 10 MG/ML IV SOLN
INTRAVENOUS | Status: DC | PRN
  Administered 2019-05-09: 300 mg via INTRAVENOUS

## 2019-05-09 MED ORDER — PANTOPRAZOLE SODIUM 40 MG PO TBEC
40.0000 mg | DELAYED_RELEASE_TABLET | Freq: Every day | ORAL | Status: DC
  Administered 2019-05-11 – 2019-05-15 (×5): 40 mg via ORAL
  Filled 2019-05-09 (×5): qty 1

## 2019-05-09 MED ORDER — NITROGLYCERIN IN D5W 200-5 MCG/ML-% IV SOLN
2.0000 ug/min | INTRAVENOUS | Status: AC
  Administered 2019-05-09: 08:00:00 16.6 ug/min via INTRAVENOUS
  Filled 2019-05-09: qty 250

## 2019-05-09 MED ORDER — METOPROLOL TARTRATE 5 MG/5ML IV SOLN: 2.5000 mg | INTRAVENOUS | Status: DC | PRN

## 2019-05-09 MED ORDER — VANCOMYCIN HCL 10 G IV SOLR
1500.0000 mg | INTRAVENOUS | Status: AC
  Administered 2019-05-09: 1500 mg via INTRAVENOUS
  Filled 2019-05-09: qty 1500

## 2019-05-09 MED ORDER — SODIUM CHLORIDE 0.9 % IV SOLN
INTRAVENOUS | Status: DC
  Filled 2019-05-09: qty 30

## 2019-05-09 MED ORDER — METOPROLOL TARTRATE 25 MG/10 ML ORAL SUSPENSION
12.5000 mg | Freq: Two times a day (BID) | ORAL | Status: DC
  Filled 2019-05-09 (×4): qty 5

## 2019-05-09 MED ORDER — MORPHINE SULFATE (PF) 2 MG/ML IV SOLN
1.0000 mg | INTRAVENOUS | Status: DC | PRN
  Administered 2019-05-10: 1 mg via INTRAVENOUS
  Filled 2019-05-09: qty 1

## 2019-05-09 MED ORDER — CHLORHEXIDINE GLUCONATE 0.12 % MT SOLN
15.0000 mL | OROMUCOSAL | Status: AC
  Administered 2019-05-09: 15 mL via OROMUCOSAL

## 2019-05-09 MED ORDER — MAGNESIUM SULFATE 4 GM/100ML IV SOLN
4.0000 g | Freq: Once | INTRAVENOUS | Status: AC
  Administered 2019-05-09: 4 g via INTRAVENOUS
  Filled 2019-05-09: qty 100

## 2019-05-09 MED ORDER — HEMOSTATIC AGENTS (NO CHARGE) OPTIME
TOPICAL | Status: DC | PRN
  Administered 2019-05-09 (×2): 1 via TOPICAL

## 2019-05-09 MED ORDER — NITROGLYCERIN IN D5W 200-5 MCG/ML-% IV SOLN: 0.0000 ug/min | INTRAVENOUS | Status: DC

## 2019-05-09 MED ORDER — LIDOCAINE 2% (20 MG/ML) 5 ML SYRINGE
INTRAMUSCULAR | Status: AC
  Filled 2019-05-09: qty 5

## 2019-05-09 MED ORDER — SODIUM CHLORIDE 0.9 % IV SOLN: 250.0000 mL | INTRAVENOUS | Status: DC

## 2019-05-09 MED ORDER — MIDAZOLAM HCL 5 MG/5ML IJ SOLN
INTRAMUSCULAR | Status: DC | PRN
  Administered 2019-05-09: 3 mg via INTRAVENOUS
  Administered 2019-05-09 (×2): 2 mg via INTRAVENOUS

## 2019-05-09 MED ORDER — HEPARIN SODIUM (PORCINE) 1000 UNIT/ML IJ SOLN
INTRAMUSCULAR | Status: DC | PRN
  Administered 2019-05-09: 28000 [IU] via INTRAVENOUS

## 2019-05-09 MED ORDER — MILRINONE LACTATE IN DEXTROSE 20-5 MG/100ML-% IV SOLN
0.3000 ug/kg/min | INTRAVENOUS | Status: DC
  Filled 2019-05-09: qty 100

## 2019-05-09 MED ORDER — SODIUM CHLORIDE 0.9% FLUSH
10.0000 mL | Freq: Two times a day (BID) | INTRAVENOUS | Status: DC
  Administered 2019-05-09 – 2019-05-10 (×2): 10 mL

## 2019-05-09 MED ORDER — CHLORHEXIDINE GLUCONATE CLOTH 2 % EX PADS
6.0000 | MEDICATED_PAD | Freq: Every day | CUTANEOUS | Status: DC
  Administered 2019-05-10 – 2019-05-11 (×2): 6 via TOPICAL

## 2019-05-09 MED ORDER — DOPAMINE-DEXTROSE 3.2-5 MG/ML-% IV SOLN
0.0000 ug/kg/min | INTRAVENOUS | Status: DC
  Filled 2019-05-09: qty 250

## 2019-05-09 MED ORDER — HEPARIN SODIUM (PORCINE) 1000 UNIT/ML IJ SOLN
INTRAMUSCULAR | Status: AC
  Filled 2019-05-09: qty 1

## 2019-05-09 MED ORDER — EPHEDRINE 5 MG/ML INJ
INTRAVENOUS | Status: AC
  Filled 2019-05-09: qty 10

## 2019-05-09 MED ORDER — ACETAMINOPHEN 160 MG/5ML PO SOLN: 1000.0000 mg | Freq: Four times a day (QID) | ORAL | Status: AC

## 2019-05-09 MED ORDER — ACETAMINOPHEN 160 MG/5ML PO SOLN: 650.0000 mg | Freq: Once | ORAL | Status: AC

## 2019-05-09 MED ORDER — VANCOMYCIN HCL IN DEXTROSE 1-5 GM/200ML-% IV SOLN
1000.0000 mg | Freq: Once | INTRAVENOUS | Status: AC
  Administered 2019-05-09: 1000 mg via INTRAVENOUS
  Filled 2019-05-09: qty 200

## 2019-05-09 MED ORDER — SODIUM CHLORIDE 0.9% FLUSH: 3.0000 mL | INTRAVENOUS | Status: DC | PRN

## 2019-05-09 MED ORDER — PHENYLEPHRINE HCL-NACL 20-0.9 MG/250ML-% IV SOLN
0.0000 ug/min | INTRAVENOUS | Status: DC
  Administered 2019-05-09: 40 ug/min via INTRAVENOUS
  Filled 2019-05-09: qty 250

## 2019-05-09 MED ORDER — EPINEPHRINE PF 1 MG/ML IJ SOLN
0.0000 ug/min | INTRAVENOUS | Status: DC
  Filled 2019-05-09: qty 4

## 2019-05-09 MED ORDER — MIDAZOLAM HCL 2 MG/2ML IJ SOLN: 2.0000 mg | INTRAMUSCULAR | Status: DC | PRN

## 2019-05-09 MED ORDER — SUCCINYLCHOLINE CHLORIDE 200 MG/10ML IV SOSY
PREFILLED_SYRINGE | INTRAVENOUS | Status: AC
  Filled 2019-05-09: qty 10

## 2019-05-09 MED ORDER — TRANEXAMIC ACID (OHS) BOLUS VIA INFUSION
15.0000 mg/kg | INTRAVENOUS | Status: AC
  Administered 2019-05-09: 09:00:00 1351.5 mg via INTRAVENOUS
  Filled 2019-05-09: qty 1352

## 2019-05-09 MED ORDER — BISACODYL 10 MG RE SUPP: 10.0000 mg | Freq: Every day | RECTAL | Status: DC

## 2019-05-09 MED ORDER — FENTANYL CITRATE (PF) 250 MCG/5ML IJ SOLN
INTRAMUSCULAR | Status: DC | PRN
  Administered 2019-05-09: 100 ug via INTRAVENOUS
  Administered 2019-05-09: 25 ug via INTRAVENOUS
  Administered 2019-05-09: 50 ug via INTRAVENOUS
  Administered 2019-05-09: 100 ug via INTRAVENOUS
  Administered 2019-05-09: 25 ug via INTRAVENOUS
  Administered 2019-05-09: 100 ug via INTRAVENOUS
  Administered 2019-05-09: 50 ug via INTRAVENOUS
  Administered 2019-05-09: 150 ug via INTRAVENOUS

## 2019-05-09 MED ORDER — BISACODYL 5 MG PO TBEC
10.0000 mg | DELAYED_RELEASE_TABLET | Freq: Every day | ORAL | Status: DC
  Administered 2019-05-10 – 2019-05-13 (×4): 10 mg via ORAL
  Filled 2019-05-09 (×6): qty 2

## 2019-05-09 MED ORDER — PLASMA-LYTE 148 IV SOLN
INTRAVENOUS | Status: DC
  Filled 2019-05-09: qty 2.5

## 2019-05-09 MED ORDER — TRAMADOL HCL 50 MG PO TABS
50.0000 mg | ORAL_TABLET | ORAL | Status: DC | PRN
  Administered 2019-05-13: 50 mg via ORAL
  Filled 2019-05-09: qty 1

## 2019-05-09 MED ORDER — TRANEXAMIC ACID (OHS) PUMP PRIME SOLUTION
2.0000 mg/kg | INTRAVENOUS | Status: DC
  Filled 2019-05-09: qty 1.8

## 2019-05-09 MED ORDER — ACETAMINOPHEN 500 MG PO TABS
1000.0000 mg | ORAL_TABLET | Freq: Four times a day (QID) | ORAL | Status: AC
  Administered 2019-05-10 – 2019-05-14 (×14): 1000 mg via ORAL
  Filled 2019-05-09 (×15): qty 2

## 2019-05-09 MED ORDER — SODIUM CHLORIDE 0.9 % IV SOLN: INTRAVENOUS | Status: DC

## 2019-05-09 MED ORDER — MANNITOL 20 % IV SOLN
INTRAVENOUS | Status: DC
  Filled 2019-05-09: qty 13

## 2019-05-09 MED ORDER — SODIUM CHLORIDE 0.9% FLUSH: 10.0000 mL | INTRAVENOUS | Status: DC | PRN

## 2019-05-09 MED ORDER — MIDAZOLAM HCL (PF) 10 MG/2ML IJ SOLN
INTRAMUSCULAR | Status: AC
  Filled 2019-05-09: qty 2

## 2019-05-09 MED ORDER — FAMOTIDINE IN NACL 20-0.9 MG/50ML-% IV SOLN
20.0000 mg | Freq: Two times a day (BID) | INTRAVENOUS | Status: AC
  Administered 2019-05-09 (×2): 20 mg via INTRAVENOUS
  Filled 2019-05-09: qty 50

## 2019-05-09 MED ORDER — DOCUSATE SODIUM 100 MG PO CAPS
200.0000 mg | ORAL_CAPSULE | Freq: Every day | ORAL | Status: DC
  Administered 2019-05-10 – 2019-05-15 (×6): 200 mg via ORAL
  Filled 2019-05-09 (×6): qty 2

## 2019-05-09 MED ORDER — 0.9 % SODIUM CHLORIDE (POUR BTL) OPTIME
TOPICAL | Status: DC | PRN
  Administered 2019-05-09: 5000 mL

## 2019-05-09 MED ORDER — ASPIRIN 81 MG PO CHEW: 324.0000 mg | CHEWABLE_TABLET | Freq: Every day | ORAL | Status: DC

## 2019-05-09 MED ORDER — OXYCODONE HCL 5 MG PO TABS
5.0000 mg | ORAL_TABLET | ORAL | Status: DC | PRN
  Administered 2019-05-10 (×2): 5 mg via ORAL
  Administered 2019-05-10 (×2): 10 mg via ORAL
  Administered 2019-05-11 – 2019-05-12 (×2): 5 mg via ORAL
  Administered 2019-05-15: 10 mg via ORAL
  Filled 2019-05-09: qty 2
  Filled 2019-05-09: qty 1
  Filled 2019-05-09: qty 2
  Filled 2019-05-09 (×2): qty 1
  Filled 2019-05-09: qty 2
  Filled 2019-05-09: qty 1

## 2019-05-09 MED ORDER — SODIUM CHLORIDE 0.9% FLUSH
3.0000 mL | Freq: Two times a day (BID) | INTRAVENOUS | Status: DC
  Administered 2019-05-10: 3 mL via INTRAVENOUS

## 2019-05-09 MED ORDER — LACTATED RINGERS IV SOLN
INTRAVENOUS | Status: DC | PRN
  Administered 2019-05-09 (×2): via INTRAVENOUS

## 2019-05-09 MED ORDER — INSULIN REGULAR BOLUS VIA INFUSION
0.0000 [IU] | Freq: Three times a day (TID) | INTRAVENOUS | Status: DC
  Filled 2019-05-09: qty 10

## 2019-05-09 MED ORDER — INSULIN REGULAR(HUMAN) IN NACL 100-0.9 UT/100ML-% IV SOLN: INTRAVENOUS | Status: DC

## 2019-05-09 MED ORDER — ONDANSETRON HCL 4 MG/2ML IJ SOLN: 4.0000 mg | Freq: Four times a day (QID) | INTRAMUSCULAR | Status: DC | PRN

## 2019-05-09 MED ORDER — SODIUM CHLORIDE 0.45 % IV SOLN
INTRAVENOUS | Status: DC | PRN
  Administered 2019-05-09: 15:00:00 via INTRAVENOUS

## 2019-05-09 MED ORDER — CHLORHEXIDINE GLUCONATE 0.12% ORAL RINSE (MEDLINE KIT)
15.0000 mL | Freq: Two times a day (BID) | OROMUCOSAL | Status: DC
  Administered 2019-05-09 – 2019-05-10 (×3): 15 mL via OROMUCOSAL

## 2019-05-09 SURGICAL SUPPLY — 96 items
BAG DECANTER FOR FLEXI CONT (MISCELLANEOUS) ×6 IMPLANT
BANDAGE ACE 4X5 VEL STRL LF (GAUZE/BANDAGES/DRESSINGS) ×2 IMPLANT
BANDAGE ACE 6X5 VEL STRL LF (GAUZE/BANDAGES/DRESSINGS) ×2 IMPLANT
BANDAGE ELASTIC 4 VELCRO ST LF (GAUZE/BANDAGES/DRESSINGS) ×2 IMPLANT
BANDAGE ELASTIC 6 VELCRO ST LF (GAUZE/BANDAGES/DRESSINGS) ×2 IMPLANT
BASKET HEART  (ORDER IN 25'S) (MISCELLANEOUS) ×1
BASKET HEART (ORDER IN 25'S) (MISCELLANEOUS) ×1
BASKET HEART (ORDER IN 25S) (MISCELLANEOUS) ×2 IMPLANT
BLADE CLIPPER SURG (BLADE) IMPLANT
BLADE STERNUM SYSTEM 6 (BLADE) ×4 IMPLANT
BLOWER MISTER CAL-MED (MISCELLANEOUS) IMPLANT
BNDG GAUZE ELAST 4 BULKY (GAUZE/BANDAGES/DRESSINGS) ×4 IMPLANT
CANISTER SUCT 3000ML PPV (MISCELLANEOUS) ×4 IMPLANT
CANNULA EZ GLIDE AORTIC 21FR (CANNULA) ×6 IMPLANT
CATH CPB KIT HENDRICKSON (MISCELLANEOUS) ×4 IMPLANT
CLIP RETRACTION 3.0MM CORONARY (MISCELLANEOUS) ×2 IMPLANT
CLIP VESOCCLUDE MED 24/CT (CLIP) IMPLANT
CLIP VESOCCLUDE SM WIDE 24/CT (CLIP) ×2 IMPLANT
CONN ST 1/4X3/8  BEN (MISCELLANEOUS) ×4
CONN ST 1/4X3/8 BEN (MISCELLANEOUS) IMPLANT
COVER WAND RF STERILE (DRAPES) ×2 IMPLANT
DERMABOND ADVANCED (GAUZE/BANDAGES/DRESSINGS) ×2
DERMABOND ADVANCED .7 DNX12 (GAUZE/BANDAGES/DRESSINGS) IMPLANT
DRAIN CHANNEL 19F RND (DRAIN) IMPLANT
DRAIN CHANNEL 28F RND 3/8 FF (WOUND CARE) ×4 IMPLANT
DRAPE CARDIOVASCULAR INCISE (DRAPES) ×2
DRAPE INCISE IOBAN 66X45 STRL (DRAPES) ×2 IMPLANT
DRAPE SLUSH/WARMER DISC (DRAPES) ×4 IMPLANT
DRAPE SRG 135X102X78XABS (DRAPES) ×2 IMPLANT
DRSG COVADERM 4X14 (GAUZE/BANDAGES/DRESSINGS) ×4 IMPLANT
ELECT REM PT RETURN 9FT ADLT (ELECTROSURGICAL) ×8
ELECTRODE REM PT RTRN 9FT ADLT (ELECTROSURGICAL) ×4 IMPLANT
FELT TEFLON 1X6 (MISCELLANEOUS) ×6 IMPLANT
GAUZE SPONGE 4X4 12PLY STRL (GAUZE/BANDAGES/DRESSINGS) ×4 IMPLANT
GAUZE SPONGE 4X4 12PLY STRL LF (GAUZE/BANDAGES/DRESSINGS) ×4 IMPLANT
GLOVE BIO SURGEON STRL SZ7 (GLOVE) ×8 IMPLANT
GLOVE BIOGEL PI IND STRL 7.5 (GLOVE) ×4 IMPLANT
GLOVE BIOGEL PI INDICATOR 7.5 (GLOVE)
GOWN STRL REUS W/ TWL LRG LVL3 (GOWN DISPOSABLE) ×8 IMPLANT
GOWN STRL REUS W/ TWL XL LVL3 (GOWN DISPOSABLE) ×4 IMPLANT
GOWN STRL REUS W/TWL LRG LVL3 (GOWN DISPOSABLE) ×8
GOWN STRL REUS W/TWL XL LVL3 (GOWN DISPOSABLE) ×4
HEMOSTAT POWDER SURGIFOAM 1G (HEMOSTASIS) ×12 IMPLANT
KIT BASIN OR (CUSTOM PROCEDURE TRAY) ×4 IMPLANT
KIT SUCTION CATH 14FR (SUCTIONS) ×8 IMPLANT
KIT TURNOVER KIT B (KITS) ×4 IMPLANT
KIT VASOVIEW HEMOPRO 2 VH 4000 (KITS) ×4 IMPLANT
LEAD PACING MYOCARDI (MISCELLANEOUS) ×6 IMPLANT
MARKER GRAFT CORONARY BYPASS (MISCELLANEOUS) ×16 IMPLANT
NS IRRIG 1000ML POUR BTL (IV SOLUTION) ×20 IMPLANT
OFFPUMP STABILIZER SUV (MISCELLANEOUS) IMPLANT
PACK E OPEN HEART (SUTURE) ×4 IMPLANT
PACK OPEN HEART (CUSTOM PROCEDURE TRAY) ×4 IMPLANT
PAD ARMBOARD 7.5X6 YLW CONV (MISCELLANEOUS) ×10 IMPLANT
PAD ELECT DEFIB RADIOL ZOLL (MISCELLANEOUS) ×4 IMPLANT
PENCIL BUTTON HOLSTER BLD 10FT (ELECTRODE) ×4 IMPLANT
POSITIONER ACROBAT-I OFFPUMP (MISCELLANEOUS) IMPLANT
POSITIONER HEAD DONUT 9IN (MISCELLANEOUS) ×4 IMPLANT
PUNCH AORTIC ROTATE 4.0MM (MISCELLANEOUS) ×2 IMPLANT
PUNCH AORTIC ROTATE 4.5MM 8IN (MISCELLANEOUS) IMPLANT
PUNCH AORTIC ROTATE 5MM 8IN (MISCELLANEOUS) IMPLANT
SET CARDIOPLEGIA MPS 5001102 (MISCELLANEOUS) ×2 IMPLANT
SPONGE LAP 18X18 RF (DISPOSABLE) IMPLANT
SPONGE LAP 4X18 RFD (DISPOSABLE) IMPLANT
SUT BONE WAX W31G (SUTURE) ×4 IMPLANT
SUT MNCRL AB 3-0 PS2 18 (SUTURE) ×8 IMPLANT
SUT MNCRL AB 4-0 PS2 18 (SUTURE) ×2 IMPLANT
SUT PDS AB 1 CTX 36 (SUTURE) ×8 IMPLANT
SUT PROLENE 2 0 SH DA (SUTURE) IMPLANT
SUT PROLENE 3 0 SH DA (SUTURE) ×4 IMPLANT
SUT PROLENE 3 0 SH1 36 (SUTURE) IMPLANT
SUT PROLENE 4 0 RB 1 (SUTURE)
SUT PROLENE 4 0 SH DA (SUTURE) IMPLANT
SUT PROLENE 4-0 RB1 .5 CRCL 36 (SUTURE) IMPLANT
SUT PROLENE 5 0 C 1 36 (SUTURE) ×12 IMPLANT
SUT PROLENE 6 0 C 1 30 (SUTURE) IMPLANT
SUT PROLENE 7 0 BV1 MDA (SUTURE) ×4 IMPLANT
SUT PROLENE 8 0 BV175 6 (SUTURE) IMPLANT
SUT PROLENE BLUE 7 0 (SUTURE) ×4 IMPLANT
SUT PROLENE POLY MONO (SUTURE) IMPLANT
SUT STEEL 6MS V (SUTURE) ×8 IMPLANT
SUT VIC AB 2-0 CT1 27 (SUTURE) ×2
SUT VIC AB 2-0 CT1 TAPERPNT 27 (SUTURE) IMPLANT
SYSTEM HEARTSTRING SEAL 3.8 (VASCULAR PRODUCTS) IMPLANT
SYSTEM HEARTSTRING SEAL 3.8MM (VASCULAR PRODUCTS)
SYSTEM HEARTSTRING SEAL 4.3 (VASCULAR PRODUCTS) IMPLANT
SYSTEM HEARTSTRING SEAL 4.3MM (VASCULAR PRODUCTS)
SYSTEM SAHARA CHEST DRAIN ATS (WOUND CARE) ×4 IMPLANT
TAPE CLOTH SURG 4X10 WHT LF (GAUZE/BANDAGES/DRESSINGS) ×4 IMPLANT
TOWEL GREEN STERILE (TOWEL DISPOSABLE) ×4 IMPLANT
TOWEL GREEN STERILE FF (TOWEL DISPOSABLE) ×4 IMPLANT
TRAY FOLEY SLVR 16FR TEMP STAT (SET/KITS/TRAYS/PACK) ×4 IMPLANT
TUBE SUCTION CARDIAC 10FR (CANNULA) ×2 IMPLANT
TUBING LAP HI FLOW INSUFFLATIO (TUBING) ×4 IMPLANT
UNDERPAD 30X30 (UNDERPADS AND DIAPERS) ×4 IMPLANT
WATER STERILE IRR 1000ML POUR (IV SOLUTION) ×8 IMPLANT

## 2019-05-09 NOTE — Anesthesia Procedure Notes (Signed)
Arterial Line Insertion Start/End7/12/2018 7:00 AM, 05/09/2019 7:05 AM Performed by: Lowella Dell, CRNA, CRNA  Patient location: Pre-op. Preanesthetic checklist: patient identified, IV checked, site marked, risks and benefits discussed, surgical consent, monitors and equipment checked, pre-op evaluation, timeout performed and anesthesia consent Lidocaine 1% used for infiltration and patient sedated Left, radial was placed Catheter size: 20 G Hand hygiene performed  and maximum sterile barriers used   Attempts: 1 Procedure performed without using ultrasound guided technique. Following insertion, Biopatch and dressing applied. Post procedure assessment: normal  Patient tolerated the procedure well with no immediate complications.

## 2019-05-09 NOTE — Progress Notes (Signed)
     [address]       [place] [address]             [phone]       Resting comfortably Awaiting extubation  Vitals:   05/09/19 1802 05/09/19 1849  BP:    Pulse:    Resp:    Temp:    SpO2: 100% 100%   CBC    Component Value Date/Time   WBC 24.1 (H) 05/09/2019 1420   RBC 2.70 (L) 05/09/2019 1420   HGB 8.8 (L) 05/09/2019 1458   HCT 26.0 (L) 05/09/2019 1458   PLT 165 05/09/2019 1420   MCV 92.2 05/09/2019 1420   MCH 30.4 05/09/2019 1420   MCHC 32.9 05/09/2019 1420   RDW 13.2 05/09/2019 1420   LYMPHSABS 1.8 05/02/2019 2332   MONOABS 1.3 (H) 05/02/2019 2332   EOSABS 0.2 05/02/2019 2332   BASOSABS 0.0 05/02/2019 2332   BMP Latest Ref Rng & Units 05/09/2019 05/09/2019 05/09/2019  Glucose 70 - 99 mg/dL - 116(H) 157(H)  BUN 8 - 23 mg/dL - - -  Creatinine 0.44 - 1.00 mg/dL - - -  Sodium 135 - 145 mmol/L 139 139 137  Potassium 3.5 - 5.1 mmol/L 4.3 4.2 4.5  Chloride 98 - 111 mmol/L - - -  CO2 22 - 32 mmol/L - - -  Calcium 8.9 - 10.3 mg/dL - - -

## 2019-05-09 NOTE — Progress Notes (Signed)
     [address]       [place], [address]             [phone]       No events overnight. Received 1 unit of blood  Vitals:   05/08/19 2032 05/09/19 0500  BP: (!) 114/56 (!) 114/53  Pulse: 68 69  Resp: 17 16  Temp: 99.2 F (37.3 C) 99.1 F (37.3 C)  SpO2: 96% 100%   Alert NAD Sinus regular EWOB  CBC    Component Value Date/Time   WBC 13.6 (H) 05/08/2019 0535   RBC 2.91 (L) 05/08/2019 0535   HGB 8.6 (L) 05/08/2019 2036   HCT 26.3 (L) 05/08/2019 2036   PLT 303 05/08/2019 0535   MCV 92.4 05/08/2019 0535   MCH 29.6 05/08/2019 0535   MCHC 32.0 05/08/2019 0535   RDW 12.4 05/08/2019 0535   LYMPHSABS 1.8 05/02/2019 2332   MONOABS 1.3 (H) 05/02/2019 2332   EOSABS 0.2 05/02/2019 2332   BASOSABS 0.0 05/02/2019 2332   BMP Latest Ref Rng & Units 05/08/2019 05/07/2019 05/06/2019  Glucose 70 - 99 mg/dL 182(H) 178(H) 173(H)  BUN 8 - 23 mg/dL 21 25(H) 31(H)  Creatinine 0.44 - 1.00 mg/dL 1.05(H) 1.10(H) 1.54(H)  Sodium 135 - 145 mmol/L 136 136 133(L)  Potassium 3.5 - 5.1 mmol/L 4.2 3.9 4.0  Chloride 98 - 111 mmol/L 105 107 100  CO2 22 - 32 mmol/L 18(L) 20(L) 22  Calcium 8.9 - 10.3 mg/dL 8.5(L) 8.8(L) 8.5(L)    NSTEMI, 3V CAD  OR today for CABG X 4

## 2019-05-09 NOTE — Progress Notes (Signed)
Rapid wean initiated.  

## 2019-05-09 NOTE — Procedures (Signed)
Extubation Procedure Note  Patient Details:   Name: Stephanie [name] DOB: 1946-02-01 MRN: [id number]   Airway Documentation:    Vent end date: 05/09/19 Vent end time: 2220   Evaluation  O2 sats: stable throughout Complications: No apparent complications Patient did tolerate procedure well. Bilateral Breath Sounds: Clear, Diminished   Yes   Pt extubated to 4L [place] per MD order and Rapid wean protocol. RN at bedside and assisted with extubation. NIF: -22 VC:.6L IS: 669ml.  No stridor note. Pt tolerating well. Will continue to monitor.   Clance Boll 05/09/2019, 10:23 PM

## 2019-05-09 NOTE — Transfer of Care (Signed)
Immediate Anesthesia Transfer of Care Note  Patient: Stephanie [name]  Procedure(s) Performed: CORONARY ARTERY BYPASS GRAFTING (CABG) times Three using Left Internal Mammary and Right Greater Saphenous Vein (N/A Chest) TRANSESOPHAGEAL ECHOCARDIOGRAM (TEE) (N/A )  Patient Location: SICU  Anesthesia Type:General  Level of Consciousness: sedated and Patient remains intubated per anesthesia plan  Airway & Oxygen Therapy: Patient remains intubated per anesthesia plan and Patient placed on Ventilator (see vital sign flow sheet for setting)  Post-op Assessment: Report given to RN and Post -op Vital signs reviewed and stable  Post vital signs: Reviewed and stable  Last Vitals:  Vitals Value Taken Time  BP 14/45 05/09/19 1411  Temp 35.8 C 05/09/19 1413  Pulse 69 05/09/19 1413  Resp 16 05/09/19 1413  SpO2 PAP 97 % on 50% FiO2 23/10 (15) 05/09/19 1413   Vitals shown include unvalidated device data.  Last Pain:  Vitals:   05/09/19 0500  TempSrc: Oral  PainSc:          Complications: No apparent anesthesia complications

## 2019-05-09 NOTE — Anesthesia Procedure Notes (Signed)
Central Venous Catheter Insertion Performed by: Audry Pili, MD, anesthesiologist Start/End7/12/2018 7:10 AM, 05/09/2019 7:30 AM Preanesthetic checklist: patient identified, IV checked, risks and benefits discussed, surgical consent, monitors and equipment checked, pre-op evaluation, timeout performed and anesthesia consent Position: Trendelenburg Lidocaine 1% used for infiltration and patient sedated Hand hygiene performed , maximum sterile barriers used  and Seldinger technique used Catheter size: 8.5 Fr Central line was placed.Sheath introducer Procedure performed using ultrasound guided technique. Ultrasound Notes:anatomy identified, needle tip was noted to be adjacent to the nerve/plexus identified, no ultrasound evidence of intravascular and/or intraneural injection and image(s) printed for medical record Attempts: 2 (First attempt at right IJ unsuccessful due to small vessel. First attempt on left side successful.) Following insertion, line sutured, dressing applied and Biopatch. Post procedure assessment: blood return through all ports, free fluid flow and no air  Patient tolerated the procedure well with no immediate complications.

## 2019-05-09 NOTE — Anesthesia Procedure Notes (Signed)
Procedure Name: Intubation Date/Time: 05/09/2019 8:13 AM Performed by: Lowella Dell, CRNA Pre-anesthesia Checklist: Patient identified, Emergency Drugs available, Suction available and Patient being monitored Patient Re-evaluated:Patient Re-evaluated prior to induction Oxygen Delivery Method: Circle System Utilized Preoxygenation: Pre-oxygenation with 100% oxygen Induction Type: IV induction Ventilation: Mask ventilation without difficulty and Oral airway inserted - appropriate to patient size Laryngoscope Size: Mac and 3 Grade View: Grade I Tube type: Oral Tube size: 8.0 mm Number of attempts: 1 Airway Equipment and Method: Stylet and Oral airway Placement Confirmation: ETT inserted through vocal cords under direct vision,  positive ETCO2 and breath sounds checked- equal and bilateral Secured at: 22 cm Tube secured with: Tape Dental Injury: Teeth and Oropharynx as per pre-operative assessment

## 2019-05-09 NOTE — Brief Op Note (Addendum)
05/02/2019 - 05/09/2019  11:57 AM  PATIENT:  Stephanie [name]  73 y.o. female  PRE-OPERATIVE DIAGNOSIS:  CAD  POST-OPERATIVE DIAGNOSIS:  CAD  PROCEDURE:  Procedure(s): CORONARY ARTERY BYPASS GRAFTING x 3 with:  LIMA->LAD SVG->.OM SVG-> PDA  TRANSESOPHAGEAL ECHOCARDIOGRAM   SURGEON:  Kamyah Wilhelmsen, Lucile Crater, MD - Primary             PHYSICIAN ASSISTANT: [place]   ANESTHESIA:   general  EBL:  Per anesthesia and perfusion records  BLOOD ADMINISTERED:2 units PRBC's  DRAINS: Left pleural and mediastinal tubes   LOCAL MEDICATIONS USED:  NONE  SPECIMEN:  No Specimen  DISPOSITION OF SPECIMEN:  N/A  COUNTS:  YES   DICTATION: .Dragon Dictation  PLAN OF CARE: Admit to inpatient   PATIENT DISPOSITION:  ICU - intubated and hemodynamically stable.   Delay start of Pharmacological VTE agent (>24hrs) due to surgical blood loss or risk of bleeding: yes

## 2019-05-09 NOTE — Discharge Summary (Addendum)
Physician Discharge Summary  Patient ID: Stephanie [name] MRN: [id number] DOB/AGE: [birth year] 73 y.o.  Admit date: 05/02/2019 Discharge date: 05/15/2019  Admission Diagnoses: Non-ST elevation myocardial infarction Hypertension COPD Depression Diabetes Mellitus Peripheral arterial disease, S/P angioplasty to right lower extremity Degenerative joint disease  Discharge Diagnoses:  Multi-vessel coronary artery disease Non-ST elevation myocardial infarction Hypertension COPD Depression Diabetes Mellitus Peripheral arterial disease, S/P angioplasty to right lower extremity Degenerative joint disease   Discharged Condition: Stable and discharged to SNF  History of Present Illness: Stephanie [name] is a 73 year old female with a past history of DM, COPD, PVD, hypertension, and depression. She recently presented with the complaint of worsening shortness of breath of 3 days duration. She was initially evaluated at [facility]  And found to have ischemic EKG changes and elevated troponin. A COVID test was negative.  She wa transferred to [facility].  She was noted to have leukocytosis a had a CT of the abdomen and pelvis that showed mild right-sided hydronephrosis as well as possible pyelonephritis. She was started on broad-spectrum IV antibiotics. She had a left heart cath on 05/07/19 that demonstrated 3-vessel coronary artery disease with preserved left ventricular function. We were asked to evaluate her for consideration of coronary artery bypass grafting.  Hospital Course:  She remained stable folowing the left heart catheterization. Coronary bypass grafting was offered to the patient and she elected to proceed with surgery. She was taken to the OR on 05/09/19 where CABG x 3 was performed without complication. She separated from cardiopulmonary bypass without difficulty. She was transferred to the CVICU in stable condition.  She  was extubated the evening of surgery without  difficulty. She remained afebrile and hemodynamically stable. Theone Murdoch, a line, chest tubes, and foley were removed early in the post operative course. Lopressor was started. This was stopped on 07/06 secondary to bradycardia. This did resolve and Lopressor was later resumed.  She was volume over loaded and diuresed. She had ABL anemia. She did  require a post op transfusion. Last H and H was 7.4 and 22.8. She was started on oral ferrous. She was weaned off the insulin drip.  Once she was tolerating a diet, Linagliptin and scheduled Insulin were restarted.  The patient's glucose remained well controlled. The patient's HGA1C pre op was 7.4. She will require follow up with her medical doctor after discharge. The patient was felt surgically stable for transfer from the ICU to PCTU for further convalescence on 07/04. She continues to progress with PT and OT. She was ambulating on room air. She has been tolerating a diet and has had a bowel movement. She became more hypertensive and was started on Lisinopril 20 mg daily on 07/08. Epicardial pacing wires have already been removed. Chest tube sutures will be removed the day of discharge. She was very deconditioned and it was felt she would benefit from a SNF. The patient is felt surgically stable for discharge today.  We ask the SNF to please do the following: 1. Please obtain vital signs at least one time daily 2.Please weigh the patient daily. If he or she continues to gain weight or develops lower extremity edema, contact the office at [phone]. 3. Ambulate patient at least three times daily and please use sternal precautions.  Significant Diagnostic Studies:   ECHOCARDIOGRAM REPORT       Patient Name:   Stephanie [name] Date of Exam: 05/03/2019 Medical Rec #:  [id number]  Height: Accession #:    [id number]             Weight:       175.0 lb Date of Birth:  11-24-1945              BSA:          1.85 m Patient Age:    72 years                BP:           137/53 mmHg Patient Gender: F                      HR:           82 bpm. Exam Location:  Inpatient    Procedure: 2D Echo  Indications:    Ischemia   History:        Patient has no prior history of Echocardiogram examinations.                 NSTEMI COPD Signs/Symptoms: Shortness of Breath Risk Factors:                 Diabetes and Hypertension.   Sonographer:    Delcie Roch Referring Phys: [id number] NISCHAL NARENDRA  IMPRESSIONS    1. The left ventricle has normal systolic function with an ejection fraction of 60-65%. The cavity size was normal. There is mildly increased left ventricular wall thickness. Left ventricular diastolic Doppler parameters are consistent with impaired  relaxation. No evidence of left ventricular regional wall motion abnormalities.  2. The right ventricle has normal systolic function. The cavity was normal. There is no increase in right ventricular wall thickness.  3. Left atrial size was moderately dilated.  4. There is moderate mitral annular calcification present. No evidence of mitral valve stenosis. Trivial mitral regurgitation.  5. The aortic valve is tricuspid. Mild calcification of the aortic valve. No stenosis of the aortic valve.  6. The inferior vena cava was dilated in size with >50% respiratory variability. No complete TR doppler jet so unable to estimate PA systolic pressure.  FINDINGS  Left Ventricle: The left ventricle has normal systolic function, with an ejection fraction of 60-65%. The cavity size was normal. There is mildly increased left ventricular wall thickness. Left ventricular diastolic Doppler parameters are consistent  with impaired relaxation. No evidence of left ventricular regional wall motion abnormalities..  Right Ventricle: The right ventricle has normal systolic function. The cavity was normal. There is no increase in right ventricular wall thickness.  Left Atrium: Left atrial size was  moderately dilated.  Right Atrium: Right atrial size was normal in size.  Interatrial Septum: No atrial level shunt detected by color flow Doppler.  Pericardium: There is no evidence of pericardial effusion.  Mitral Valve: The mitral valve is normal in structure. There is moderate mitral annular calcification present. Mitral valve regurgitation is trivial by color flow Doppler. No evidence of mitral valve stenosis.  Tricuspid Valve: The tricuspid valve is normal in structure. Tricuspid valve regurgitation was not visualized by color flow Doppler.  Aortic Valve: The aortic valve is tricuspid Mild calcification of the aortic valve. Aortic valve regurgitation was not visualized by color flow Doppler. There is No stenosis of the aortic valve.  Pulmonic Valve: The pulmonic valve was normal in structure. Pulmonic valve regurgitation is trivial by color flow Doppler.  Venous: The inferior vena cava is dilated in size with greater than 50% respiratory variability.    +--------------+--------++  LEFT VENTRICLE         +----------------+---------++ +--------------+--------++ Diastology                PLAX 2D                +----------------+---------++ +--------------+--------++ LV e' lateral:  9.79 cm/s LVIDd:        4.60 cm  +----------------+---------++ +--------------+--------++ LV E/e' lateral:9.6       LVIDs:        3.30 cm  +----------------+---------++ +--------------+--------++ LV e' medial:   6.31 cm/s LV PW:        1.40 cm  +----------------+---------++ +--------------+--------++ LV E/e' medial: 14.8      LV IVS:       1.30 cm  +----------------+---------++ +--------------+--------++ LVOT diam:    2.30 cm  +--------------+--------++ LV SV:        53 ml    +--------------+--------++ LVOT Area:    4.15 cm +--------------+--------++                        +--------------+--------++   +---------------+----------++ RIGHT VENTRICLE           +---------------+----------++ RV S prime:    19.30 cm/s +---------------+----------++ TAPSE (M-mode):2.3 cm     +---------------+----------++  +---------------+-------++-----------++ LEFT ATRIUM           Index       +---------------+-------++-----------++ LA diam:       3.90 cm2.11 cm/m  +---------------+-------++-----------++ LA Vol (A2C):  87.7 ml47.48 ml/m +---------------+-------++-----------++ LA Vol (A4C):  77.5 ml41.95 ml/m +---------------+-------++-----------++ LA Biplane Vol:84.4 ml45.69 ml/m +---------------+-------++-----------++ +------------+---------+++ RIGHT ATRIUM          +------------+---------+++ RA Pressure:8.00 mmHg +------------+---------+++  +------------+-----------++ AORTIC VALVE            +------------+-----------++ LVOT Vmax:  141.00 cm/s +------------+-----------++ LVOT Vmean: 98.500 cm/s +------------+-----------++ LVOT VTI:   0.304 m     +------------+-----------++   +-------------+-------++ AORTA                +-------------+-------++ Ao Root diam:3.20 cm +-------------+-------++  +--------------+----------++  +---------------+---------++ MITRAL VALVE              TRICUSPID VALVE          +--------------+----------++  +---------------+---------++ MV Area (PHT):3.31 cm    Estimated RAP: 8.00 mmHg +--------------+----------++  +---------------+---------++ MV PHT:       66.41 msec +--------------+----------++  +--------------+-------+ MV Decel Time:229 msec    SHUNTS                +--------------+----------++  +--------------+-------+ +--------------+-----------++ Systemic VTI: 0.30 m  MV E velocity:93.60 cm/s  +--------------+-------+ +--------------+-----------++ Systemic Diam:2.30 cm MV A velocity:122.00 cm/s +--------------+-------+  +--------------+-----------++ MV E/A ratio: 0.77        +--------------+-----------++    Marca Anconaalton Mclean MD Electronically signed by Marca Anconaalton Mclean MD Signature Date/Time: 05/03/2019/4:05:53 PM    DATE OF PROCEDURE: 05/07/2019  DATE OF DISCHARGE:   CARDIAC CATHETERIZATION  History obtained from chart review. Stephanie [name] is a 89 year old moderately overweight Caucasian female admitted several days ago with heart failure. She was diuresed 5 L. Her EF was normal. She has a history of treated hypertension, diabetes and hyperlipidemia as well as PAD. Her troponins were mildly elevated. Because of this she was referred for diagnostic coronary angiography to define her anatomy.  FFR: The patient received 10,000 units of heparin with an ACT of 285. Using a 6 JamaicaFrench XB LAD 3.5 cm guide catheter along with a  0.14 pro-water guidewire and an assist FFR device I normalized outside the body, placed the pro-water in the mid LAD and passed the FFR device beyond the proximal lesion. The initial FFR prior to adenosine infusion was 0.86 following 2.79 with adenosine.  IMPRESSION: Stephanie [name] was admitted with heart failure with preserved LV function. Troponins were fairly low and flat. She is diabetic. Coronary coronary angiography revealed three-vessel disease with high-grade ostial nondominant circumflex not amenable to PCI, high-grade mid dominant RCA stenosis and moderate segmental proximal LAD disease with positive FFR of 0.79. Have reviewed with Dr. Eldridge Dace and discussed with her attending, Dr. Elease Hashimoto. We will get T CTS consult for revascularization. The radial sheath was removed and a TR band was placed on the right wrist to achieve patent hemostasis. The femoral sheath was secured in place.  Stephanie [name]. MD, [facility]  05/07/2019  11:51 AM      Recommendations Antiplatelet/Anticoag Recommend Aspirin 81mg  daily for moderate CAD.     Surgeon Notes   05/09/2019 2:14 PM Op Note signed by Corliss Skains, MD     Indications Non-STEMI (non-ST elevated myocardial infarction) (HCC) [I21.4 (ICD-10-CM)]     Procedural Details Technical Details PROCEDURE DESCRIPTION:   The patient was brought to the second floor [place] [facility] cath lab in the postabsorptive state. She was premedicated with IV Versed and fentanyl. Her right wrist and groinWere prepped and shaved in usual sterile fashion. Xylocaine 1% was used for local anesthesia. A 6 French sheath was inserted into the right radial artery using standard Seldinger technique. I was unable to pass TIG catheter and versa core wire up the radial artery and therefore abandoned this approach and accessed the right common femoral artery. I placed a 5 French sheath in the right common femoral artery. 5 French right left Judkins diagnostic catheters on 5 French pigtail catheter were used for selective coronary angiography and obtain left heart pressures. Visipaque dye was used for the entirety of the case. Retrograde aorta, left ventricular and pullback pressures were recorded.  Estimated blood loss <50 mL.   During this procedure medications were administered to achieve and maintain moderate conscious sedation while the patient's heart rate, blood pressure, and oxygen saturation were continuously monitored and I was present face-to-face 100% of this time.     Medications (Filter: Administrations occurring from 05/07/19 0957 to 05/07/19 1157)  Continuous medications are totaled by the amount administered until 05/07/19 1157.   Medication Rate/Dose/Volume Action   Date Time    fentaNYL (SUBLIMAZE) injection (mcg) 25 mcg Given 05/07/19 1016   Total dose as of 05/07/19 1157 25 mcg Given 1036   50 mcg        midazolam (VERSED) injection (mg) 1 mg Given 05/07/19 1016   Total dose as of 05/07/19 1157        1 mg        0.9 % sodium chloride infusion (mL) 250 mL - 10 mL/hr New Bag/Given 05/07/19 1017   Total dose as of 05/07/19 1157        Cannot be  calculated        lidocaine (PF) (XYLOCAINE) 1 % injection (mL) 2 mL Given 05/07/19 1027   Total dose as of 05/07/19 1157 15 mL Given 1038   17 mL        Radial Cocktail (Verapamil 5 mg, NTG, Lidocaine) (mL) 10 mL Given 05/07/19 1031   Total dose as of 05/07/19 1157  10 mL        Heparin (Porcine) in NaCl 1000-0.9 UT/500ML-% SOLN (mL) 500 mL Given 05/07/19 1032   Total dose as of 05/07/19 1157 500 mL Given 1032   1,500 mL 500 mL Given 1032   heparin injection (Units) 10,000 Units Given 05/07/19 1059   Total dose as of 05/07/19 1157        10,000 Units        adenosine (diagnostic) 140 mcg/kg/min (mcg/kg/min) 140 mcg/kg/min - 256.8 mL/hr New Bag/Given 05/07/19 1127   Dosing weight: 91.7 kg  Stopped 1129   Total dose as of 05/07/19 1157        25.89 mg        iohexol (OMNIPAQUE) 350 MG/ML injection (mL) 120 mL Given 05/07/19 1139   Total dose as of 05/07/19 1157        120 mL        cefTRIAXone (ROCEPHIN) 1 g in sodium chloride 0.9 % 100 mL IVPB (mL/hr) *Not included in total [facility] Hold 05/07/19 0957   Dosing weight: 79.4 kg *Not included in total Automatically Held 1000   Total dose as of 05/07/19 1157        Cannot be calculated        collagenase (SANTYL) ointment *Not included in total [facility] Hold 05/07/19 0957   Dosing weight: 79.4 kg *Not included in total Automatically Held 1000   Total dose as of 05/07/19 1157        Cannot be calculated        ipratropium-albuterol (DUONEB) 0.5-2.5 (3) MG/3ML nebulizer solution 3 mL (mL) *Not included in total [facility] Hold 05/07/19 0957   Dosing weight: 79.4 kg        Total dose as of 05/07/19 1157        Cannot be calculated        aspirin EC tablet 81 mg (mg) *Not included in total [facility] Hold 05/07/19 0957   Total dose as of 05/07/19 1157        Cannot be calculated        bisoprolol (ZEBETA) tablet 2.5 mg (mg) *Not included in total [facility] Hold 05/07/19 0957   Total dose as of 05/07/19 1157        Cannot be calculated        clopidogrel  (PLAVIX) tablet 75 mg (mg) *Not included in total [facility] Hold 05/07/19 0957   Total dose as of 05/07/19 1157        Cannot be calculated        famotidine (PEPCID) tablet 10 mg (mg) *Not included in total [facility] Hold 05/07/19 0957   Total dose as of 05/07/19 1157        Cannot be calculated        gabapentin (NEURONTIN) capsule 200 mg (mg) *Not included in total [facility] Hold 05/07/19 0957   Total dose as of 05/07/19 1157        Cannot be calculated        heparin ADULT infusion 100 units/mL (25000 units/271mL sodium chloride 0.45%) (Units/hr)  Stopped 05/07/19 0958   Total dose as of 05/07/19 1157        Cannot be calculated        insulin aspart (novoLOG) injection 0-5 Units (Units) *Not included in total [facility] Hold 05/07/19 0957   Total dose as of 05/07/19 1157        Cannot be calculated        insulin aspart (novoLOG) injection  0-9 Units (Units) *Not included in total [facility] Hold 05/07/19 0957   Total dose as of 05/07/19 1157 *Not included in total Automatically Held 1130   Cannot be calculated        insulin aspart (novoLOG) injection 3 Units (Units) *Not included in total [facility] Hold 05/07/19 0957   Total dose as of 05/07/19 1157 *Not included in total Automatically Held 1130   Cannot be calculated        polyethylene glycol (MIRALAX / GLYCOLAX) packet 17 g (g) *Not included in total [facility] Hold 05/07/19 0957   Total dose as of 05/07/19 1157        Cannot be calculated        rosuvastatin (CRESTOR) tablet 20 mg (mg) *Not included in total [facility] Hold 05/07/19 0957   Total dose as of 05/07/19 1157        Cannot be calculated        senna-docusate (Senokot-S) tablet 1 tablet (tablet) *Not included in total [facility] Hold 05/07/19 0957   Total dose as of 05/07/19 1157        Cannot be calculated        topiramate (TOPAMAX) tablet 25 mg (mg) *Not included in total [facility] Hold 05/07/19 0957   Total dose as of 05/07/19 1157        Cannot be calculated           Sedation Time Sedation Time Physician-1: 1 hour 15  minutes 24 seconds           Coronary Findings Diagnostic Dominance: Right  Left Anterior Descending  Ost LAD to Prox LAD lesion 60% stenosed  Ost LAD to Prox LAD lesion is 60% stenosed.   Ramus Intermedius  Ramus lesion 60% stenosed  Ramus lesion is 60% stenosed.   Left Circumflex  Ost Cx to Prox Cx lesion 95% stenosed  Ost Cx to Prox Cx lesion is 95% stenosed.   Second Obtuse Marginal Branch  2nd Mrg lesion 90% stenosed  2nd Mrg lesion is 90% stenosed.   Right Coronary Artery  Mid RCA lesion 90% stenosed  Mid RCA lesion is 90% stenosed.  Intervention No interventions have been documented.                            Coronary Diagrams Diagnostic Dominance: Right    Treatments: Surgery    [address] [place] [address] [phone]   05/09/19 Patient:  Stephanie [name] Pre-Op Dx: NSTEMI, 3V CAD   Post-op Dx:  same Procedure: CABG X 3.  LIMA LAD, RSVG OM, RSVG PDA   Endoscopic greater saphenous vein harvest on the right Intra-operative Transesophageal Echocardiogram  Surgeon and Role:      * Lightfoot, Eliezer Lofts, MD - Primary    * S. Dorris Fetch - assisting  1st Assistant:  M.Roddenberry, PA-C Anesthesia  general EBL:  1000 ml Blood Administration: 2 units of pRBCs Xclamp Time: Pump Time:  Drains: 28 F blake drain: L, mediastinal  Wires: atrial wires X 2 Counts: correct   Indications: Stephanie [name] [id number] y.[facility] after being admitted for worsening shortness of breath. She was noted to have elevated troponins, and a BNP of 2310. After medical optimization, and diuresis, she underwent a left heart cath which showed 3V disease.   Findings: Good conduit.  Intramyocardial OM target.  Good PDA target.  Calcified, intramyocardial LAD target.  2nd lateral wall target  was too small for  grafting.  Good flows on all venous grafts.  Good function of post-CPB TEE.   Discharge Exam: Blood pressure (!) 151/64, pulse 69, temperature 97.7 F (36.5 C), temperature source Axillary, resp. rate 16, height 5\' 6"  (1.676 m), weight 93.8 kg, SpO2 98 %.   Cardiovascular: RRR Pulmonary: Mostly clear Abdomen: Soft, non tender, bowel sounds present. Extremities:Trace bilateral lower extremity edema. Chronic venous stasis changes ankles distally. Right upper arm with +++ ecchymosis (stable) Wounds: Clean and dry.  No erythema or signs of infection.   Discharge Instructions    Amb Referral to Cardiac Rehabilitation   Complete by: As directed    Referring to [place] [facility]   Diagnosis:  CABG NSTEMI     CABG X ___: 3   After initial evaluation and assessments completed: Virtual Based Care may be provided alone or in conjunction with Phase 2 Cardiac Rehab based on patient barriers.: Yes     Allergies as of 05/15/2019      Reactions   Penicillins Other (See Comments)   Per MAR Did it involve swelling of the face/tongue/throat, SOB, or low BP? Unknown Did it involve sudden or severe rash/hives, skin peeling, or any reaction on the inside of your mouth or nose? Unknown Did you need to seek medical attention at a hospital or doctor's office? Unknown When did it last happen? Unk If all above answers are "NO", may proceed with cephalosporin use.      Medication List    STOP taking these medications   clindamycin 300 MG capsule Commonly known as: CLEOCIN   doxycycline 100 MG tablet Commonly known as: VIBRA-TABS   guaiFENesin 100 MG/5ML liquid Commonly known as: ROBITUSSIN   hydrALAZINE 50 MG tablet Commonly known as: APRESOLINE   INVanz 1 g injection Generic drug: ertapenem   ondansetron 4 MG tablet Commonly known as: ZOFRAN     TAKE these medications   aspirin 81 MG EC tablet Take 1 tablet (81 mg total) by mouth daily. Start taking on: May 16, 2019 What changed:  additional instructions   clopidogrel 75 MG tablet Commonly known as: PLAVIX Take 75 mg by mouth daily.   diclofenac sodium 1 % Gel Commonly known as: VOLTAREN Apply 2 g topically 4 (four) times daily. Both ankles   famotidine 20 MG tablet Commonly known as: PEPCID Take 20 mg by mouth daily.   ferrous sulfate 325 (65 FE) MG tablet Take 1 tablet (325 mg total) by mouth daily with breakfast. For one month then stop. If she develops constipation, may stop   fexofenadine 180 MG tablet Commonly known as: ALLEGRA Take 180 mg by mouth daily.   fluticasone 50 MCG/ACT nasal spray Commonly known as: FLONASE Place 2 sprays into both nostrils daily.   furosemide 40 MG tablet Commonly known as: LASIX Take 1 tablet (40 mg total) by mouth daily. For 4 days then stop.   gabapentin 600 MG tablet Commonly known as: NEURONTIN Take 600 mg by mouth every 8 (eight) hours.   insulin aspart 100 UNIT/ML injection Commonly known as: novoLOG Inject 0-12 Units into the skin See admin instructions. Sliding Scale three times daily with meals 0-150 0 units 151-200 2 units 201-250 4 units 251-300 6 units 301-350 8 units 351-400 10 units 401-450 12 units   insulin glargine 100 UNIT/ML injection Commonly known as: LANTUS Inject 20 Units into the skin at bedtime.   Ipratropium-Albuterol 20-100 MCG/ACT Aers respimat Commonly known as: COMBIVENT Inhale 1 puff into  the lungs every 6 (six) hours as needed for wheezing or shortness of breath.   linagliptin 5 MG Tabs tablet Commonly known as: TRADJENTA Take 5 mg by mouth daily.   lisinopril 20 MG tablet Commonly known as: ZESTRIL Take 1 tablet (20 mg total) by mouth daily. What changed:   medication strength  how much to take  when to take this   loperamide 2 MG tablet Commonly known as: IMODIUM A-D Take 2 mg by mouth 4 (four) times daily as needed for diarrhea or loose stools.   metoprolol tartrate 25 MG tablet Commonly known as:  LOPRESSOR Take 1 tablet (25 mg total) by mouth 2 (two) times daily.   PARoxetine 37.5 MG 24 hr tablet Commonly known as: PAXIL-CR Take 75 mg by mouth daily.   Potassium Chloride ER 20 MEQ Tbcr Take 20 mEq by mouth daily. For 4 days then stop.   PROBIOTIC PO Take 1 capsule by mouth daily.   rosuvastatin 10 MG tablet Commonly known as: CRESTOR Take 10 mg by mouth at bedtime.   sennosides-docusate sodium 8.6-50 MG tablet Commonly known as: SENOKOT-S Take 2 tablets by mouth daily as needed for constipation.   SUMAtriptan 50 MG tablet Commonly known as: IMITREX Take 50 mg by mouth daily as needed for migraine. May repeat in 2 hours if headache persists or recurs.   topiramate 25 MG capsule Commonly known as: TOPAMAX Take 25 mg by mouth at bedtime.   traMADol 50 MG tablet Commonly known as: ULTRAM Take 1 tablet (50 mg total) by mouth every 6 (six) hours as needed for moderate pain.   Trulicity 0.75 MG/0.5ML Sopn Generic drug: Dulaglutide Inject 0.75 mg into the skin every Tuesday.      Follow-up Information    Filbert SchilderMcDaniel, Jill D, NP. Go on 05/21/2019.   Specialty: Cardiology Why: You have a follow-up cardiology appointment with Georgie ChardJill McDaniel, NP on Tuesday 05/21/19 at 3:15pm. Contact information: [address] [place] [address] [phone]        Corliss SkainsLightfoot, Harrell O, MD. Go on 06/07/2019.   Specialty: Thoracic Surgery Why: You have an appointment with Dr. Cliffton AstersLightfoot on Friday 06/07/19 at 10:30am.  Please arrive 30 minutes early for a chest x-ray to [facility] located on the first floor of the same building. Contact information: [address] [place] [address] [phone]        Levin ErpJones, Brittney M, NP. Call.   Specialty: Adult Health Nurse Practitioner Why: for a follow up appointment regarding further diabetes management and surveillance of HGA1C 7.4 Contact information: [address] [place] [address]  [phone]        Jake BatheSkains, Mark C, MD .   Specialty: Cardiology Contact information: [address] [place] [address] [phone]          The patient has been discharged on:   1.Beta Blocker:  Yes [ x  ]                              No   [   ]                              If No, reason:  2.Ace Inhibitor/ARB: Yes [ x  ]  No  [    ]                                     If No, reason:  3.Statin:   Yes [ x  ]                  No  [   ]                  If No, reason:  4.Ecasa:  Yes  [ x  ]                  No   [   ]                  If No, reason:      Signed: Ardelle Balls, PA-C 05/15/2019, 3:42 PM

## 2019-05-09 NOTE — Op Note (Signed)
[address]       [place] [id number]             [phone]                                         05/09/19 Patient:  Stephanie [name] Pre-Op Dx: NSTEMI, 3V CAD   Post-op Dx:  same Procedure: CABG X 3.  LIMA LAD, RSVG OM, RSVG PDA   Endoscopic greater saphenous vein harvest on the right Intra-operative Transesophageal Echocardiogram  Surgeon and Role:      * Florencio Hollibaugh, Eliezer LoftsHarrell O, MD - Primary    * S. Dorris FetchHendrickson - assisting  1st Assistant:  M.Roddenberry, PA-C Anesthesia  general EBL:  1000 ml Blood Administration: 2 units of pRBCs Xclamp Time: 92min Pump Time: 135min  Drains: 28 F blake drain: L, mediastinal  Wires: atrial wires X 2 Counts: correct   Indications: Yetta Numbersoni Renee Long [name] 73 y.o. female [facility] after being admitted for worsening shortness of breath.  She was noted to have elevated troponins, and a BNP of 2310.  After medical optimization, and diuresis, she underwent a left heart cath which showed 3V disease.    Findings: Good conduit.  Intramyocardial OM target.  Good PDA target.  Calcified, intramyocardial LAD target.  2nd lateral wall target was too small for grafting.  Good flows on all venous grafts.  Good function of post-CPB TEE.  Operative Technique: All invasive lines were placed in pre-op holding.  After the risks, benefits and alternatives were thoroughly discussed, the patient was brought to the operative theatre.  Anesthesia was induced, and the patient was prepped and draped in normal sterile fashion.  An appropriate surgical pause was performed, and pre-operative antibiotics were dosed accordingly.  We began with simultaneous incisions were made along the right leg for harvesting of the greater saphenous vein and the chest for the sternotomy.  In regards to the sternotomy, this was carried down with bovie cautery, and the sternum was divided with a reciprocating saw.  Meticulous  hemostasis was obtained.  The left internal thoracic artery was exposed and harvested in in pedicled fashion.  The patient was systemically heparinized, and the artery was divided distally, and placed in a papaverine sponge.    The sternal elevator was removed, and a retractor was placed.  The pericardium was divided in the midline and fashioned into a cradle with pericardial stitches.   After we confirmed an appropriate ACT, the ascending aorta was cannulated in standard fashion.  The right atrial appendage was used for venous cannulation site.  Cardiopulmonary bypass was initiated, and the heart retractor was placed. The cross clamp was applied, and a dose of anterograde cardioplegia was given with good arrest of the heart.  We moved to the posterior wall of the heart, and found a good target on the PDA.  An arteriotomy was made, and the vein graft was anastomosed to it in an end to side fashion.  Next we exposed the lateral wall, and found a good target on the OM.  An end to side anastomosis with the vein graft was then created.  Finally, we exposed the anterior wall of the heart and identified a good target on the LAD, and fashioned an end to side anastomosis between it and the LITA.  We began to re-warm, and  a re-animation dose of cardioplegia was given.  The heart was de-aired, and the cross clamp was removed.  Meticulous hemostasis was obtained.    A partial occludding clamp was then placed on the ascending aorta, and we created an end to side anastomosis between it and the proximal vein grafts.  The proximal site were marked with rings.  Hemostasis was obtained, and we separated from cardiopulmonary bypass without event.the heparin was reversed with protamine.  Chest tubes and wires were placed, and the sternum was re-approximated with with sternal wires.  The soft tissue and skin were re-approximated wth absorbable suture.    The patient tolerated the procedure without any immediate complications, and  was transferred to the ICU is guarded condition.  Ramey Ketcherside Bary Leriche

## 2019-05-09 NOTE — Anesthesia Procedure Notes (Signed)
Central Venous Catheter Insertion Performed by: Audry Pili, MD, anesthesiologist Start/End7/12/2018 7:30 AM, 05/09/2019 7:34 AM Patient location: Pre-op. Preanesthetic checklist: patient identified, IV checked, risks and benefits discussed, surgical consent, monitors and equipment checked, pre-op evaluation, timeout performed and anesthesia consent Position: Trendelenburg Hand hygiene performed  and maximum sterile barriers used  Total catheter length 10. PA cath was placed.Swan type:thermodilution PA Cath depth:60 Procedure performed without using ultrasound guided technique. Attempts: 1 Patient tolerated the procedure well with no immediate complications.

## 2019-05-09 NOTE — Progress Notes (Signed)
Echocardiogram Echocardiogram Transesophageal has been performed.  Oneal Deputy Stephanie [name] 05/09/2019, 8:37 AM

## 2019-05-10 ENCOUNTER — Inpatient Hospital Stay (HOSPITAL_COMMUNITY): Payer: Medicare Other

## 2019-05-10 DIAGNOSIS — Z951 Presence of aortocoronary bypass graft: Secondary | ICD-10-CM

## 2019-05-10 LAB — CBC
HCT: 24.7 % — ABNORMAL LOW (ref 36.0–46.0)
HCT: 24.1 % — ABNORMAL LOW (ref 36.0–46.0)
HCT: 24.8 % — ABNORMAL LOW (ref 36.0–46.0)
Hemoglobin: 8.3 g/dL — ABNORMAL LOW (ref 12.0–15.0)
Hemoglobin: 7.9 g/dL — ABNORMAL LOW (ref 12.0–15.0)
Hemoglobin: 8 g/dL — ABNORMAL LOW (ref 12.0–15.0)
MCH: 30.7 pg (ref 26.0–34.0)
MCH: 30.2 pg (ref 26.0–34.0)
MCH: 29.9 pg (ref 26.0–34.0)
MCHC: 33.6 g/dL (ref 30.0–36.0)
MCHC: 32.8 g/dL (ref 30.0–36.0)
MCHC: 32.3 g/dL (ref 30.0–36.0)
MCV: 91.5 fL (ref 80.0–100.0)
MCV: 92 fL (ref 80.0–100.0)
MCV: 92.5 fL (ref 80.0–100.0)
Platelets: 208 10*3/uL (ref 150–400)
Platelets: 189 10*3/uL (ref 150–400)
Platelets: 172 10*3/uL (ref 150–400)
RBC: 2.7 MIL/uL — ABNORMAL LOW (ref 3.87–5.11)
RBC: 2.62 MIL/uL — ABNORMAL LOW (ref 3.87–5.11)
RBC: 2.68 MIL/uL — ABNORMAL LOW (ref 3.87–5.11)
RDW: 14 % (ref 11.5–15.5)
RDW: 14.1 % (ref 11.5–15.5)
RDW: 14.1 % (ref 11.5–15.5)
WBC: 17.1 10*3/uL — ABNORMAL HIGH (ref 4.0–10.5)
WBC: 19.5 10*3/uL — ABNORMAL HIGH (ref 4.0–10.5)
WBC: 20 10*3/uL — ABNORMAL HIGH (ref 4.0–10.5)
nRBC: 0 % (ref 0.0–0.2)
nRBC: 0 % (ref 0.0–0.2)
nRBC: 0 % (ref 0.0–0.2)

## 2019-05-10 LAB — POCT I-STAT 7, (LYTES, BLD GAS, ICA,H+H)
Acid-base deficit: 6 mmol/L — ABNORMAL HIGH (ref 0.0–2.0)
Bicarbonate: 18.7 mmol/L — ABNORMAL LOW (ref 20.0–28.0)
Calcium, Ion: 1.27 mmol/L (ref 1.15–1.40)
HCT: 21 % — ABNORMAL LOW (ref 36.0–46.0)
Hemoglobin: 7.1 g/dL — ABNORMAL LOW (ref 12.0–15.0)
O2 Saturation: 96 %
Patient temperature: 37.8
Potassium: 4.3 mmol/L (ref 3.5–5.1)
Sodium: 137 mmol/L (ref 135–145)
TCO2: 20 mmol/L — ABNORMAL LOW (ref 22–32)
pCO2 arterial: 34.6 mmHg (ref 32.0–48.0)
pH, Arterial: 7.345 — ABNORMAL LOW (ref 7.350–7.450)
pO2, Arterial: 92 mmHg (ref 83.0–108.0)

## 2019-05-10 LAB — BASIC METABOLIC PANEL
Anion gap: 6 (ref 5–15)
Anion gap: 11 (ref 5–15)
BUN: 18 mg/dL (ref 8–23)
BUN: 21 mg/dL (ref 8–23)
CO2: 19 mmol/L — ABNORMAL LOW (ref 22–32)
CO2: 18 mmol/L — ABNORMAL LOW (ref 22–32)
Calcium: 7.8 mg/dL — ABNORMAL LOW (ref 8.9–10.3)
Calcium: 8.1 mg/dL — ABNORMAL LOW (ref 8.9–10.3)
Chloride: 110 mmol/L (ref 98–111)
Chloride: 107 mmol/L (ref 98–111)
Creatinine, Ser: 1.04 mg/dL — ABNORMAL HIGH (ref 0.44–1.00)
Creatinine, Ser: 1.29 mg/dL — ABNORMAL HIGH (ref 0.44–1.00)
GFR calc Af Amer: >60 mL/min (ref >60)
GFR calc Af Amer: 48 mL/min — ABNORMAL LOW (ref >60)
GFR calc non Af Amer: 54 mL/min — ABNORMAL LOW (ref >60)
GFR calc non Af Amer: 41 mL/min — ABNORMAL LOW (ref >60)
Glucose, Bld: 117 mg/dL — ABNORMAL HIGH (ref 70–99)
Glucose, Bld: 211 mg/dL — ABNORMAL HIGH (ref 70–99)
Potassium: 4.4 mmol/L (ref 3.5–5.1)
Potassium: 4.4 mmol/L (ref 3.5–5.1)
Sodium: 135 mmol/L (ref 135–145)
Sodium: 136 mmol/L (ref 135–145)

## 2019-05-10 LAB — CREATININE, SERUM
Creatinine, Ser: 1.17 mg/dL — ABNORMAL HIGH (ref 0.44–1.00)
GFR calc Af Amer: 54 mL/min — ABNORMAL LOW (ref >60)
GFR calc non Af Amer: 47 mL/min — ABNORMAL LOW (ref >60)

## 2019-05-10 LAB — GLUCOSE, CAPILLARY
Glucose-Capillary: 111 mg/dL — ABNORMAL HIGH (ref 70–99)
Glucose-Capillary: 118 mg/dL — ABNORMAL HIGH (ref 70–99)
Glucose-Capillary: 119 mg/dL — ABNORMAL HIGH (ref 70–99)
Glucose-Capillary: 120 mg/dL — ABNORMAL HIGH (ref 70–99)
Glucose-Capillary: 120 mg/dL — ABNORMAL HIGH (ref 70–99)
Glucose-Capillary: 123 mg/dL — ABNORMAL HIGH (ref 70–99)
Glucose-Capillary: 124 mg/dL — ABNORMAL HIGH (ref 70–99)
Glucose-Capillary: 126 mg/dL — ABNORMAL HIGH (ref 70–99)
Glucose-Capillary: 127 mg/dL — ABNORMAL HIGH (ref 70–99)
Glucose-Capillary: 129 mg/dL — ABNORMAL HIGH (ref 70–99)
Glucose-Capillary: 129 mg/dL — ABNORMAL HIGH (ref 70–99)
Glucose-Capillary: 130 mg/dL — ABNORMAL HIGH (ref 70–99)
Glucose-Capillary: 140 mg/dL — ABNORMAL HIGH (ref 70–99)
Glucose-Capillary: 149 mg/dL — ABNORMAL HIGH (ref 70–99)
Glucose-Capillary: 159 mg/dL — ABNORMAL HIGH (ref 70–99)
Glucose-Capillary: 159 mg/dL — ABNORMAL HIGH (ref 70–99)
Glucose-Capillary: 205 mg/dL — ABNORMAL HIGH (ref 70–99)

## 2019-05-10 LAB — PREPARE RBC (CROSSMATCH)

## 2019-05-10 LAB — MAGNESIUM
Magnesium: 2.6 mg/dL — ABNORMAL HIGH (ref 1.7–2.4)
Magnesium: 2.4 mg/dL (ref 1.7–2.4)

## 2019-05-10 MED ORDER — ENOXAPARIN SODIUM 40 MG/0.4ML ~~LOC~~ SOLN
40.0000 mg | Freq: Every day | SUBCUTANEOUS | Status: DC
  Administered 2019-05-10 – 2019-05-14 (×5): 40 mg via SUBCUTANEOUS
  Filled 2019-05-10 (×5): qty 0.4

## 2019-05-10 MED ORDER — INSULIN ASPART 100 UNIT/ML ~~LOC~~ SOLN
0.0000 [IU] | SUBCUTANEOUS | Status: DC
  Administered 2019-05-10: 8 [IU] via SUBCUTANEOUS
  Administered 2019-05-10 – 2019-05-11 (×5): 2 [IU] via SUBCUTANEOUS

## 2019-05-10 NOTE — Progress Notes (Signed)
      [address]       [place] [address]             [phone]      POD # 1 CABG  Up in chair  BP (!) 161/148   Pulse 75   Temp 99.1 F (37.3 C) (Oral)   Resp 15   Ht 5\' 6"  (1.676 m)   Wt 96.7 kg   LMP  (LMP Unknown)   SpO2 97%   BMI 34.41 kg/m   Intake/Output Summary (Last 24 hours) at 05/10/2019 1655 Last data filed at 05/10/2019 1500 Gross per 24 hour  Intake 2631.26 ml  Output 1680 ml  Net 951.26 ml   cbg well controlled  PM labs pending  Doing well POD # 1   Davionne Dowty C. Roxan Hockey, MD [facility] [phone]

## 2019-05-10 NOTE — Progress Notes (Signed)
     [address]       [place] [address]             [phone]       No events.  Extubated at [place].  Vitals:   05/10/19 0730 05/10/19 0800  BP:  (!) 98/45  Pulse: 86 86  Resp: 14 11  Temp: 99.3 F (37.4 C) 99.3 F (37.4 C)  SpO2: 97% 97%   PAP: (16-30)/(6-20) 23/9 CO:  [3.8 L/min-5.2 L/min] 5.2 L/min CI:  [1.9 L/min/m2-2.6 L/min/m2] 2.6 L/min/m2 I/O last 3 completed shifts: In: 6691.6 [P.O.:240; I.V.:4720.1; Blood:735; IV Piggyback:996.6] Out: 8546 [Urine:2160; Emesis/NG output:80; GUYQI:3474; Blood:735; Chest Tube:430]  Alert NAD Sinus in the 80s EWOB SS chest tube output Dressings clean  EKG - stable CXR - slight haziness at right base.  Otherwise clear  CBC    Component Value Date/Time   WBC 17.1 (H) 05/10/2019 0247   RBC 2.70 (L) 05/10/2019 0247   HGB 7.1 (L) 05/10/2019 0455   HCT 21.0 (L) 05/10/2019 0455   PLT 208 05/10/2019 0247   MCV 91.5 05/10/2019 0247   MCH 30.7 05/10/2019 0247   MCHC 33.6 05/10/2019 0247   RDW 14.0 05/10/2019 0247   LYMPHSABS 1.5 05/09/2019 2027   MONOABS 1.2 (H) 05/09/2019 2027   EOSABS 0.1 05/09/2019 2027   BASOSABS 0.1 05/09/2019 2027   BMP Latest Ref Rng & Units 05/10/2019 05/10/2019 05/09/2019  Glucose 70 - 99 mg/dL - 117(H) -  BUN 8 - 23 mg/dL - 18 -  Creatinine 0.44 - 1.00 mg/dL - 1.04(H) -  Sodium 135 - 145 mmol/L 137 135 137  Potassium 3.5 - 5.1 mmol/L 4.3 4.4 4.6  Chloride 98 - 111 mmol/L - 110 -  CO2 22 - 32 mmol/L - 19(L) -  Calcium 8.9 - 10.3 mg/dL - 7.8(L) -    POD 1 s/p CABG X 3. - Neuro: stable, pain controlled - CV: on asp, statin, BB.  Will remove wires, swan, and A line - Pulm: aggressive pulm toilet - Renal: stable.  Good uop - GI: will advance diet - Heme: will transfuse 1 unit of blood - ID: afebrile - Endo: will transition to SSI - Dispo: continue ICU care.

## 2019-05-11 ENCOUNTER — Inpatient Hospital Stay (HOSPITAL_COMMUNITY): Payer: Medicare Other

## 2019-05-11 LAB — BASIC METABOLIC PANEL
Anion gap: 7 (ref 5–15)
BUN: 21 mg/dL (ref 8–23)
CO2: 20 mmol/L — ABNORMAL LOW (ref 22–32)
Calcium: 8.2 mg/dL — ABNORMAL LOW (ref 8.9–10.3)
Chloride: 107 mmol/L (ref 98–111)
Creatinine, Ser: 1.2 mg/dL — ABNORMAL HIGH (ref 0.44–1.00)
GFR calc Af Amer: 52 mL/min — ABNORMAL LOW (ref >60)
GFR calc non Af Amer: 45 mL/min — ABNORMAL LOW (ref >60)
Glucose, Bld: 167 mg/dL — ABNORMAL HIGH (ref 70–99)
Potassium: 4.4 mmol/L (ref 3.5–5.1)
Sodium: 134 mmol/L — ABNORMAL LOW (ref 135–145)

## 2019-05-11 LAB — CBC
HCT: 23.3 % — ABNORMAL LOW (ref 36.0–46.0)
Hemoglobin: 7.6 g/dL — ABNORMAL LOW (ref 12.0–15.0)
MCH: 30.5 pg (ref 26.0–34.0)
MCHC: 32.6 g/dL (ref 30.0–36.0)
MCV: 93.6 fL (ref 80.0–100.0)
Platelets: 166 10*3/uL (ref 150–400)
RBC: 2.49 MIL/uL — ABNORMAL LOW (ref 3.87–5.11)
RDW: 14.1 % (ref 11.5–15.5)
WBC: 18.2 10*3/uL — ABNORMAL HIGH (ref 4.0–10.5)
nRBC: 0 % (ref 0.0–0.2)

## 2019-05-11 LAB — GLUCOSE, CAPILLARY
Glucose-Capillary: 145 mg/dL — ABNORMAL HIGH (ref 70–99)
Glucose-Capillary: 156 mg/dL — ABNORMAL HIGH (ref 70–99)
Glucose-Capillary: 202 mg/dL — ABNORMAL HIGH (ref 70–99)
Glucose-Capillary: 250 mg/dL — ABNORMAL HIGH (ref 70–99)
Glucose-Capillary: 227 mg/dL — ABNORMAL HIGH (ref 70–99)

## 2019-05-11 LAB — PROTIME-INR
INR: 1.3 — ABNORMAL HIGH (ref 0.8–1.2)
Prothrombin Time: 15.6 seconds — ABNORMAL HIGH (ref 11.4–15.2)

## 2019-05-11 MED ORDER — POTASSIUM CHLORIDE ER 10 MEQ PO TBCR
20.0000 meq | EXTENDED_RELEASE_TABLET | Freq: Every day | ORAL | Status: DC
  Administered 2019-05-11 – 2019-05-15 (×5): 20 meq via ORAL
  Filled 2019-05-11 (×11): qty 2

## 2019-05-11 MED ORDER — SODIUM CHLORIDE 0.9% FLUSH
3.0000 mL | Freq: Two times a day (BID) | INTRAVENOUS | Status: DC
  Administered 2019-05-12 – 2019-05-14 (×6): 3 mL via INTRAVENOUS

## 2019-05-11 MED ORDER — INSULIN GLARGINE 100 UNIT/ML ~~LOC~~ SOLN
20.0000 [IU] | Freq: Every day | SUBCUTANEOUS | Status: DC
  Administered 2019-05-11: 20 [IU] via SUBCUTANEOUS
  Filled 2019-05-11 (×3): qty 0.2

## 2019-05-11 MED ORDER — SODIUM CHLORIDE 0.9 % IV SOLN: 250.0000 mL | INTRAVENOUS | Status: DC | PRN

## 2019-05-11 MED ORDER — LINAGLIPTIN 5 MG PO TABS
5.0000 mg | ORAL_TABLET | Freq: Every day | ORAL | Status: DC
  Administered 2019-05-11 – 2019-05-15 (×5): 5 mg via ORAL
  Filled 2019-05-11 (×5): qty 1

## 2019-05-11 MED ORDER — FUROSEMIDE 40 MG PO TABS
40.0000 mg | ORAL_TABLET | Freq: Every day | ORAL | Status: DC
  Administered 2019-05-11 – 2019-05-15 (×5): 40 mg via ORAL
  Filled 2019-05-11 (×5): qty 1

## 2019-05-11 MED ORDER — MAGNESIUM HYDROXIDE 400 MG/5ML PO SUSP: 30.0000 mL | Freq: Every day | ORAL | Status: DC | PRN

## 2019-05-11 MED ORDER — DULAGLUTIDE 0.75 MG/0.5ML ~~LOC~~ SOAJ: 0.7500 mg | SUBCUTANEOUS | Status: DC

## 2019-05-11 MED ORDER — CLOPIDOGREL BISULFATE 75 MG PO TABS
75.0000 mg | ORAL_TABLET | Freq: Every day | ORAL | Status: DC
  Administered 2019-05-11 – 2019-05-15 (×5): 75 mg via ORAL
  Filled 2019-05-11 (×5): qty 1

## 2019-05-11 MED ORDER — ALUM & MAG HYDROXIDE-SIMETH 200-200-20 MG/5ML PO SUSP: 15.0000 mL | Freq: Four times a day (QID) | ORAL | Status: DC | PRN

## 2019-05-11 MED ORDER — MOVING RIGHT ALONG BOOK
Freq: Once | Status: DC
  Filled 2019-05-11: qty 1

## 2019-05-11 MED ORDER — SODIUM CHLORIDE 0.9% FLUSH
3.0000 mL | INTRAVENOUS | Status: DC | PRN
  Administered 2019-05-11: 3 mL via INTRAVENOUS
  Filled 2019-05-11: qty 3

## 2019-05-11 MED ORDER — GABAPENTIN 600 MG PO TABS
600.0000 mg | ORAL_TABLET | Freq: Three times a day (TID) | ORAL | Status: DC
  Administered 2019-05-11 – 2019-05-15 (×12): 600 mg via ORAL
  Filled 2019-05-11 (×12): qty 1

## 2019-05-11 MED ORDER — INSULIN ASPART 100 UNIT/ML ~~LOC~~ SOLN
0.0000 [IU] | Freq: Three times a day (TID) | SUBCUTANEOUS | Status: DC
  Administered 2019-05-11 (×2): 5 [IU] via SUBCUTANEOUS
  Administered 2019-05-12: 3 [IU] via SUBCUTANEOUS
  Administered 2019-05-12 (×2): 5 [IU] via SUBCUTANEOUS
  Administered 2019-05-13 (×2): 3 [IU] via SUBCUTANEOUS
  Administered 2019-05-13: 2 [IU] via SUBCUTANEOUS
  Administered 2019-05-14 – 2019-05-15 (×5): 3 [IU] via SUBCUTANEOUS

## 2019-05-11 MED ORDER — SUMATRIPTAN SUCCINATE 50 MG PO TABS
50.0000 mg | ORAL_TABLET | Freq: Every day | ORAL | Status: DC | PRN
  Filled 2019-05-11: qty 1

## 2019-05-11 MED ORDER — PAROXETINE HCL ER 37.5 MG PO TB24
37.5000 mg | ORAL_TABLET | Freq: Every day | ORAL | Status: DC
  Administered 2019-05-11 – 2019-05-15 (×5): 37.5 mg via ORAL
  Filled 2019-05-11 (×5): qty 1

## 2019-05-11 NOTE — Progress Notes (Signed)
2 Days Post-Op Procedure(s) (LRB): CORONARY ARTERY BYPASS GRAFTING (CABG) times Three using Left Internal Mammary and Right Greater Saphenous Vein (N/A) TRANSESOPHAGEAL ECHOCARDIOGRAM (TEE) (N/A) Subjective: No complaints currently  Objective: Vital signs in last 24 hours: Temp:  [97.9 F (36.6 C)-99.2 F (37.3 C)] 98.4 F (36.9 C) (07/04 0838) Pulse Rate:  [31-85] 71 (07/04 0800) Cardiac Rhythm: Atrial paced (07/03 2000) Resp:  [7-21] 17 (07/04 0800) BP: (89-161)/(40-148) 110/45 (07/04 0800) SpO2:  [73 %-100 %] 94 % (07/04 0800) Arterial Line BP: (123-167)/(46-69) 147/64 (07/03 1400) Weight:  [98.5 kg] 98.5 kg (07/04 0500)  Hemodynamic parameters for last 24 hours:    Intake/Output from previous day: 07/03 0701 - 07/04 0700 In: 1618.1 [I.V.:888.1; Blood:630; IV Piggyback:100] Out: 875 [Urine:655; Chest Tube:220] Intake/Output this shift: No intake/output data recorded.  General appearance: alert, cooperative and no distress Neurologic: intact Heart: regular rate and rhythm Lungs: diminished breath sounds bibasilar Abdomen: normal findings: soft, non-tender Wound: clean and dry  Lab Results: Recent Labs    05/10/19 1811 05/11/19 0256  WBC 20.0* 18.2*  HGB 8.0* 7.6*  HCT 24.8* 23.3*  PLT 172 166   BMET:  Recent Labs    05/10/19 1811 05/11/19 0256  NA 136 134*  K 4.4 4.4  CL 107 107  CO2 18* 20*  GLUCOSE 211* 167*  BUN 21 21  CREATININE 1.29* 1.20*  CALCIUM 8.1* 8.2*    PT/INR:  Recent Labs    05/09/19 1420  LABPROT 18.8*  INR 1.6*   ABG    Component Value Date/Time   PHART 7.345 (L) 05/10/2019 0455   HCO3 18.7 (L) 05/10/2019 0455   TCO2 20 (L) 05/10/2019 0455   ACIDBASEDEF 6.0 (H) 05/10/2019 0455   O2SAT 96.0 05/10/2019 0455   CBG (last 3)  Recent Labs    05/10/19 2329 05/11/19 0332 05/11/19 0649  GLUCAP 159* 145* 156*    Assessment/Plan: S/P Procedure(s) (LRB): CORONARY ARTERY BYPASS GRAFTING (CABG) times Three using Left  Internal Mammary and Right Greater Saphenous Vein (N/A) TRANSESOPHAGEAL ECHOCARDIOGRAM (TEE) (N/A) -CV- in SR RESP- continue IS for atelectasis RENAL- creatinine down slightly to 1.20 ENDO- CBG well controlled  Resume PO meds, lantus, change SSI to [facility] and HS Anemia secondary to ABL- Hgb 7.6 follow Dc chest tubes, central line, Foley transfer to 4E telemetry   LOS: 9 days    Melrose Nakayama 05/11/2019

## 2019-05-12 ENCOUNTER — Inpatient Hospital Stay (HOSPITAL_COMMUNITY): Payer: Medicare Other

## 2019-05-12 ENCOUNTER — Encounter (HOSPITAL_COMMUNITY): Payer: Self-pay | Admitting: Thoracic Surgery (Cardiothoracic Vascular Surgery)

## 2019-05-12 LAB — BPAM RBC
Blood Product Expiration Date: 202007042359
Blood Product Expiration Date: 202007102359
Blood Product Expiration Date: 202007142359
Blood Product Expiration Date: 202007252359
Blood Product Expiration Date: 202007262359
Blood Product Expiration Date: 202007262359
Blood Product Expiration Date: 202007292359
ISSUE DATE / TIME: 202006260930
ISSUE DATE / TIME: 202007011553
ISSUE DATE / TIME: 202007020911
ISSUE DATE / TIME: 202007020911
ISSUE DATE / TIME: 202007020941
ISSUE DATE / TIME: 202007030922
ISSUE DATE / TIME: 202007041123
Unit Type and Rh: 9500
Unit Type and Rh: 9500
Unit Type and Rh: 9500
Unit Type and Rh: 9500
Unit Type and Rh: 9500
Unit Type and Rh: 9500
Unit Type and Rh: 9500

## 2019-05-12 LAB — CBC
HCT: 23.4 % — ABNORMAL LOW (ref 36.0–46.0)
Hemoglobin: 7.7 g/dL — ABNORMAL LOW (ref 12.0–15.0)
MCH: 30.7 pg (ref 26.0–34.0)
MCHC: 32.9 g/dL (ref 30.0–36.0)
MCV: 93.2 fL (ref 80.0–100.0)
Platelets: 189 10*3/uL (ref 150–400)
RBC: 2.51 MIL/uL — ABNORMAL LOW (ref 3.87–5.11)
RDW: 14.1 % (ref 11.5–15.5)
WBC: 14.9 10*3/uL — ABNORMAL HIGH (ref 4.0–10.5)
nRBC: 0 % (ref 0.0–0.2)

## 2019-05-12 LAB — TYPE AND SCREEN
ABO/RH(D): O NEG
Antibody Screen: POSITIVE
Unit division: 0
Unit division: 0
Unit division: 0
Unit division: 0
Unit division: 0
Unit division: 0
Unit division: 0

## 2019-05-12 LAB — GLUCOSE, CAPILLARY
Glucose-Capillary: 216 mg/dL — ABNORMAL HIGH (ref 70–99)
Glucose-Capillary: 239 mg/dL — ABNORMAL HIGH (ref 70–99)
Glucose-Capillary: 191 mg/dL — ABNORMAL HIGH (ref 70–99)
Glucose-Capillary: 153 mg/dL — ABNORMAL HIGH (ref 70–99)

## 2019-05-12 LAB — BASIC METABOLIC PANEL
Anion gap: 11 (ref 5–15)
BUN: 23 mg/dL (ref 8–23)
CO2: 19 mmol/L — ABNORMAL LOW (ref 22–32)
Calcium: 8.2 mg/dL — ABNORMAL LOW (ref 8.9–10.3)
Chloride: 103 mmol/L (ref 98–111)
Creatinine, Ser: 1.13 mg/dL — ABNORMAL HIGH (ref 0.44–1.00)
GFR calc Af Amer: 56 mL/min — ABNORMAL LOW (ref >60)
GFR calc non Af Amer: 49 mL/min — ABNORMAL LOW (ref >60)
Glucose, Bld: 225 mg/dL — ABNORMAL HIGH (ref 70–99)
Potassium: 3.9 mmol/L (ref 3.5–5.1)
Sodium: 133 mmol/L — ABNORMAL LOW (ref 135–145)

## 2019-05-12 MED ORDER — INSULIN GLARGINE 100 UNIT/ML ~~LOC~~ SOLN
25.0000 [IU] | Freq: Every day | SUBCUTANEOUS | Status: DC
  Filled 2019-05-12: qty 0.25

## 2019-05-12 MED ORDER — INSULIN ASPART 100 UNIT/ML ~~LOC~~ SOLN
3.0000 [IU] | Freq: Three times a day (TID) | SUBCUTANEOUS | Status: DC
  Administered 2019-05-12 – 2019-05-13 (×4): 3 [IU] via SUBCUTANEOUS

## 2019-05-12 MED ORDER — INSULIN GLARGINE 100 UNIT/ML ~~LOC~~ SOLN
15.0000 [IU] | Freq: Two times a day (BID) | SUBCUTANEOUS | Status: DC
  Administered 2019-05-12 (×2): 15 [IU] via SUBCUTANEOUS
  Filled 2019-05-12 (×4): qty 0.15

## 2019-05-12 NOTE — Plan of Care (Signed)
  Problem: Education: Goal: Knowledge of General Education information will improve Description Including pain rating scale, medication(s)/side effects and non-pharmacologic comfort measures Outcome: Progressing   Problem: Health Behavior/Discharge Planning: Goal: Ability to manage health-related needs will improve Outcome: Progressing   

## 2019-05-12 NOTE — Progress Notes (Signed)
Inpatient Diabetes Program Recommendations  AACE/ADA: New Consensus Statement on Inpatient Glycemic Control (2015)  Target Ranges:  Prepandial:   less than 140 mg/dL      Peak postprandial:   less than 180 mg/dL (1-2 hours)      Critically ill patients:  140 - 180 mg/dL   Lab Results  Component Value Date   GLUCAP 239 (H) 05/12/2019   HGBA1C 7.4 (H) 05/02/2019    Review of Glycemic Control Results for Stephanie [name], Stephanie [name] (MRN [id number]) as of 05/12/2019 11:55  Ref. Range 05/11/2019 21:32 05/12/2019 07:28 05/12/2019 11:42  Glucose-Capillary Latest Ref Range: 70 - 99 mg/dL 227 (H) 216 (H) 239 (H)   Diabetes history: Dm 2 Outpatient Diabetes medications:  Trulicity 8.12 mg weekly, Novolog 0-12 units tid with meals, Lantus 20 units q HS, Tradjenta 5 mg daily  Current orders for Inpatient glycemic control:  Lantus 15 units bid (just increased), Novolog moderate tid with meals  Inpatient Diabetes Program Recommendations:    Referral received.  A1C indicates fairly good control of CBG's prior to admit.  Consider adding Novolog 3 units tid with meals (hold if patient eats less than 50%).   Thanks  Adah Perl, RN, BC-ADM Inpatient Diabetes Coordinator Pager [phone]

## 2019-05-12 NOTE — Progress Notes (Signed)
[address]       [place] [address]             [phone]      3 Days Post-Op Procedure(s) (LRB): CORONARY ARTERY BYPASS GRAFTING (CABG) times Three using Left Internal Mammary and Right Greater Saphenous Vein (N/A) TRANSESOPHAGEAL ECHOCARDIOGRAM (TEE) (N/A) Subjective: Feels okay this morning. She is tired and it takes a lot of energy to go to the bathroom and back to bed.   Objective: Vital signs in last 24 hours: Temp:  [98.4 F (36.9 C)-98.5 F (36.9 C)] 98.4 F (36.9 C) (07/04 2028) Pulse Rate:  [67-78] 72 (07/04 2028) Cardiac Rhythm: Normal sinus rhythm (07/05 0738) Resp:  [13-23] 13 (07/05 0447) BP: (99-149)/(43-113) 113/70 (07/04 2028) SpO2:  [81 %-100 %] 91 % (07/04 2028) Weight:  [99.7 kg] 99.7 kg (07/05 0447)     Intake/Output from previous day: 07/04 0701 - 07/05 0700 In: 1633.8 [P.O.:780; I.V.:163.8] Out: 25 [Chest Tube:25] Intake/Output this shift: Total I/O In: 120 [P.O.:120] Out: -   General appearance: alert, cooperative and no distress Heart: regular rate and rhythm, S1, S2 normal, no murmur, click, rub or gallop Lungs: clear to auscultation bilaterally Abdomen: soft, non-tender; bowel sounds normal; no masses,  no organomegaly Extremities: chronic discoloration, skin breakdown on the left leg Wound: covered with a sterile dressing-can remove today  Lab Results: Recent Labs    05/11/19 0256 05/12/19 0309  WBC 18.2* 14.9*  HGB 7.6* 7.7*  HCT 23.3* 23.4*  PLT 166 189   BMET:  Recent Labs    05/11/19 0256 05/12/19 0309  NA 134* 133*  K 4.4 3.9  CL 107 103  CO2 20* 19*  GLUCOSE 167* 225*  BUN 21 23  CREATININE 1.20* 1.13*  CALCIUM 8.2* 8.2*    PT/INR:  Recent Labs    05/11/19 1312  LABPROT 15.6*  INR 1.3*   ABG    Component Value Date/Time   PHART 7.345 (L) 05/10/2019 0455   HCO3 18.7 (L) 05/10/2019 0455   TCO2 20 (L) 05/10/2019 0455   ACIDBASEDEF 6.0 (H) 05/10/2019 0455   O2SAT 96.0 05/10/2019  0455   CBG (last 3)  Recent Labs    05/11/19 1534 05/11/19 2132 05/12/19 0728  GLUCAP 250* 227* 216*    Assessment/Plan: S/P Procedure(s) (LRB): CORONARY ARTERY BYPASS GRAFTING (CABG) times Three using Left Internal Mammary and Right Greater Saphenous Vein (N/A) TRANSESOPHAGEAL ECHOCARDIOGRAM (TEE) (N/A)  1. CV- NSR in the 70s. BP well controlled. Continue asa, statin, and metoprolol 2. Pulm-tolerating room air with good oxygen saturation. Continue IS. Chest tubes have been removed. CRX stable with a possible small left effusion and left lower lobes atelectasis.  3. Renal-creatinine is 1.13 and trending down. Electrolytes okay. Continue daily lasix-remains 9 kg over baseline. 4. H and H 7.7/23.4, expected acute blood loss anemia. Has been stable.  5. Blood glucose has been uncontrolled. Will increase Lantus and consult diabetes educator.  6. Continue plavix  7. Large right upper arm hematoma. Marked and appears to have not grown from yesterday. Instructed to elevate and apply warm compresses. Right hand is also swollen significantly.  8. Consult placed to PT/OT- patient needs a lot of help to the bathroom and to even move from the bed to the chair.   Plan: Continue to work on debility. Continue lasix for fluid overload. Increase Lantus for uncontrolled blood glucose level.    LOS: 10 days    Stephanie [name]  05/12/2019  

## 2019-05-13 LAB — GLUCOSE, CAPILLARY
Glucose-Capillary: 142 mg/dL — ABNORMAL HIGH (ref 70–99)
Glucose-Capillary: 191 mg/dL — ABNORMAL HIGH (ref 70–99)
Glucose-Capillary: 198 mg/dL — ABNORMAL HIGH (ref 70–99)
Glucose-Capillary: 218 mg/dL — ABNORMAL HIGH (ref 70–99)

## 2019-05-13 LAB — BPAM RBC
Blood Product Expiration Date: 202007172359
Blood Product Expiration Date: 202007172359
ISSUE DATE / TIME: 202007030916
Unit Type and Rh: 9500
Unit Type and Rh: 9500

## 2019-05-13 LAB — TYPE AND SCREEN
ABO/RH(D): O NEG
Antibody Screen: POSITIVE
Unit division: 0
Unit division: 0

## 2019-05-13 MED ORDER — INSULIN GLARGINE 100 UNIT/ML ~~LOC~~ SOLN
18.0000 [IU] | Freq: Two times a day (BID) | SUBCUTANEOUS | Status: DC
  Administered 2019-05-13 (×2): 18 [IU] via SUBCUTANEOUS
  Filled 2019-05-13 (×4): qty 0.18

## 2019-05-13 NOTE — Evaluation (Signed)
Occupational Therapy Evaluation Patient Details Name: Stephanie [name] MRN: [id number] DOB: 28-Apr-1946 Today's Date: 05/13/2019    History of Present Illness 73 yo admitted to [facility] 6/25 with NSTEMI then tranferred to [facility] s/p CABG x 3 on 7/2. PMhx: CHF, UTI, Rt knee OA, HTN, COPD, PVD, DM   Clinical Impression   PTA patient reports living at [facility] in [place], independently completing basic ADLs and using RW/cane for mobility.  She was admitted for above and limited by problem list below, including sternal precautions and adherence, impaired balance, generalized weakness, impaired cognition, R UE edema, and decreased activity tolerance. Patient currently requires mod assist for UB ADLs, max assist for LB ADLs, mod assist for transfers, min assist for grooming. VSS during session, pt fatigued from sitting up in chair all day. She will benefit from continued OT services while admitted in order to maximize return to PLOF and increased independence with ADLs/mobility while adhering to precautions.       Follow Up Recommendations  SNF;Supervision/Assistance - 24 hour    Equipment Recommendations  Other (comment)(TBD at next venue of care)    Recommendations for Other Services       Precautions / Restrictions Precautions Precautions: Fall;Sternal Precaution Booklet Issued: Yes (comment) Precaution Comments: reviewed precautions with pt, good recall of "tube" but cueing to adhere functionally       Mobility Bed Mobility Overal bed mobility: Needs Assistance Bed Mobility: Sit to Sidelying;Rolling Rolling: Mod assist Sidelying to sit: Max assist     Sit to sidelying: Max assist General bed mobility comments: cueing for technique and precaution adherance, returned to sidelying with guiding support for trunk and max assist to raise LEs to bed; rolling with mod assist to adjust pads  Transfers Overall transfer level: Needs assistance Equipment used: Rolling walker (2  wheeled) Transfers: Sit to/from Stand Sit to Stand: Mod assist         General transfer comment: from elevated EOB, cueing for hand placement and technique with increased forward lean; physical support to ascend and maintain balance    Balance Overall balance assessment: Needs assistance Sitting-balance support: No upper extremity supported;Feet supported Sitting balance-Leahy Scale: Fair Sitting balance - Comments: able to engage in ADLs with close supervision    Standing balance support: Bilateral upper extremity supported;During functional activity Standing balance-Leahy Scale: Poor Standing balance comment: reliant on bil UE support in standing                           ADL either performed or assessed with clinical judgement   ADL Overall ADL's : Needs assistance/impaired Eating/Feeding: Set up;Sitting   Grooming: Sitting;Minimal assistance   Upper Body Bathing: Minimal assistance;Sitting   Lower Body Bathing: Moderate assistance;Sit to/from stand   Upper Body Dressing : Moderate assistance;Sitting   Lower Body Dressing: Maximal assistance;Sit to/from stand   Toilet Transfer: Moderate assistance;RW Toilet Transfer Details (indicate cue type and reason): simulated side stepping towards HOB          Functional mobility during ADLs: Moderate assistance;Rolling walker;Cueing for safety;Cueing for sequencing General ADL Comments: pt requires increased time and effort, limited by generalized weakness, impaired balance, precautions and cognition     Vision         Perception     Praxis      Pertinent Vitals/Pain Pain Assessment: Faces Faces Pain Scale: Hurts a little bit Pain Location: generalized Pain Descriptors / Indicators: Discomfort Pain Intervention(s): Monitored during session;Repositioned  Hand Dominance Right   Extremity/Trunk Assessment Upper Extremity Assessment Upper Extremity Assessment: Generalized weakness(within sternal  precautions- noted R UE edema, brusing)   Lower Extremity Assessment Lower Extremity Assessment: Defer to PT evaluation   Cervical / Trunk Assessment Cervical / Trunk Assessment: Kyphotic   Communication Communication Communication: No difficulties   Cognition Arousal/Alertness: Awake/alert Behavior During Therapy: Flat affect Overall Cognitive Status: Impaired/Different from baseline Area of Impairment: Problem solving;Following commands;Safety/judgement;Memory                     Memory: Decreased short-term memory;Decreased recall of precautions Following Commands: Follows one step commands inconsistently;Follows one step commands with increased time Safety/Judgement: Decreased awareness of safety;Decreased awareness of deficits   Problem Solving: Slow processing;Difficulty sequencing;Requires verbal cues;Requires tactile cues General Comments: pt with slow processing and difficulty adhering to precautions functionally   General Comments  educated on edema reduction to R UE (fist pumps), noted brusing around elbow -RN aware    Exercises General Exercises - Lower Extremity Long Arc Quad: AROM;Both;10 reps;Seated Hip Flexion/Marching: AROM;Both;Seated;10 reps   Shoulder Instructions      Home Living Family/patient expects to be discharged to:: Skilled nursing facility                                 Additional Comments: pt recently moved to [facility]      Prior Functioning/Environment Level of Independence: Needs assistance  Gait / Transfers Assistance Needed: walks with RW or cane ADL's / Homemaking Assistance Needed: pt reports she is able to dress and bathe on her own, staff does cooking and cleaning            OT Problem List: Decreased strength;Decreased activity tolerance;Decreased range of motion;Impaired balance (sitting and/or standing);Decreased safety awareness;Decreased knowledge of use of DME or AE;Decreased knowledge of  precautions;Obesity      OT Treatment/Interventions: Self-care/ADL training;Energy conservation;DME and/or AE instruction;Therapeutic exercise;Therapeutic activities;Patient/family education;Balance training;Cognitive remediation/compensation    OT Goals(Current goals can be found in the care plan section) Acute Rehab OT Goals Patient Stated Goal: return to [facility] Goal Formulation: With patient Time For Goal Achievement: 05/27/19 Potential to Achieve Goals: Good  OT Frequency: Min 2X/week   Barriers to D/C:            Co-evaluation              AM-PAC OT "6 Clicks" Daily Activity     Outcome Measure Help from another person eating meals?: A Little Help from another person taking care of personal grooming?: A Little Help from another person toileting, which includes using toliet, bedpan, or urinal?: Total Help from another person bathing (including washing, rinsing, drying)?: A Lot Help from another person to put on and taking off regular upper body clothing?: A Lot Help from another person to put on and taking off regular lower body clothing?: A Lot 6 Click Score: 13   End of Session Equipment Utilized During Treatment: Gait belt;Rolling walker Nurse Communication: Mobility status  Activity Tolerance: Patient limited by fatigue Patient left: in bed;with call bell/phone within reach;with bed alarm set  OT Visit Diagnosis: Other abnormalities of gait and mobility (R26.89);Muscle weakness (generalized) (M62.81)                Time: [id number] OT Time Calculation (min): 22 min Charges:  OT General Charges $OT Visit: 1 Visit OT Evaluation $OT Eval Moderate Complexity: 1 Mod  Taneisha Fuson  Juanell FairlyS Kaylyn Garrow, OT Acute Rehabilitation Services Pager [phone] Office [phone]   Chancy MilroyChristie S Adelai Achey 05/13/2019, 2:55 PM

## 2019-05-13 NOTE — Care Management Important Message (Signed)
Important Message  Patient Details  Name: Stephanie [name] MRN: [id number] Date of Birth: 1946-11-04   Medicare Important Message Given:  Yes     [name] 05/13/2019, 1:20 PM

## 2019-05-13 NOTE — Progress Notes (Signed)
Progress Note  Patient Name: Algis Downsoni Renee Long Wishon Date of Encounter: 05/13/2019  Primary Cardiologist: Donato SchultzMark Skains, MD   Subjective   Progressing slowly, weak.  Physical therapy.  Inpatient Medications    Scheduled Meds: . acetaminophen  1,000 mg Oral Q6H   Or  . acetaminophen (TYLENOL) oral liquid 160 mg/5 mL  1,000 mg Per Tube Q6H  . aspirin EC  325 mg Oral Daily   Or  . aspirin  324 mg Per Tube Daily  . bisacodyl  10 mg Oral Daily   Or  . bisacodyl  10 mg Rectal Daily  . Chlorhexidine Gluconate Cloth  6 each Topical Daily  . clopidogrel  75 mg Oral Daily  . collagenase   Topical Daily  . docusate sodium  200 mg Oral Daily  . enoxaparin (LOVENOX) injection  40 mg Subcutaneous QHS  . furosemide  40 mg Oral Daily  . gabapentin  600 mg Oral Q8H  . insulin aspart  0-15 Units Subcutaneous TID WC  . insulin aspart  3 Units Subcutaneous TID WC  . insulin glargine  18 Units Subcutaneous BID  . linagliptin  5 mg Oral Daily  . mouth rinse  15 mL Mouth Rinse BID  . moving right along book   Does not apply Once  . pantoprazole  40 mg Oral Daily  . PARoxetine  37.5 mg Oral Daily  . potassium chloride  20 mEq Oral Daily  . rosuvastatin  10 mg Oral QHS  . sodium chloride flush  3 mL Intravenous Q12H   Continuous Infusions: . sodium chloride     PRN Meds: sodium chloride, alum & mag hydroxide-simeth, ipratropium-albuterol, magnesium hydroxide, metoprolol tartrate, ondansetron (ZOFRAN) IV, oxyCODONE, sodium chloride flush, SUMAtriptan, traMADol   Vital Signs    Vitals:   05/12/19 1325 05/12/19 1330 05/12/19 1915 05/13/19 0452  BP:   (!) 132/56 128/80  Pulse:   78 63  Resp: 16 15 14 13   Temp:   98.2 F (36.8 C) 98.6 F (37 C)  TempSrc:   Oral Oral  SpO2:   97% 95%  Weight:    96.7 kg  Height:        Intake/Output Summary (Last 24 hours) at 05/13/2019 0934 Last data filed at 05/13/2019 0457 Gross per 24 hour  Intake 360 ml  Output 2300 ml  Net -1940 ml   Last 3  Weights 05/13/2019 05/12/2019 05/11/2019  Weight (lbs) 213 lb 3 oz 219 lb 12.8 oz 217 lb 2.5 oz  Weight (kg) 96.7 kg 99.7 kg 98.5 kg      Telemetry    Sinus rhythm- Personally Reviewed  ECG    No new- Personally Reviewed  Physical Exam   GEN: No acute distress.  In chair Neck: No JVD Cardiac: RRR, no murmurs, rubs, or gallops.  CABG scar Respiratory: Clear to auscultation bilaterally. GI: Soft, nontender, non-distended  MS: No edema; No deformity. Neuro:  Nonfocal  Psych: Normal affect   Labs    High Sensitivity Troponin:   Recent Labs  Lab 05/02/19 2332 05/03/19 0615  TROPONINIHS 755* 609*      Cardiac EnzymesNo results for input(s): TROPONINI in the last 168 hours. No results for input(s): TROPIPOC in the last 168 hours.   Chemistry Recent Labs  Lab 05/10/19 1811 05/11/19 0256 05/12/19 0309  NA 136 134* 133*  K 4.4 4.4 3.9  CL 107 107 103  CO2 18* 20* 19*  GLUCOSE 211* 167* 225*  BUN 21 21 23  CREATININE 1.29* 1.20* 1.13*  CALCIUM 8.1* 8.2* 8.2*  GFRNONAA 41* 45* 49*  GFRAA 48* 52* 56*  ANIONGAP 11 7 11      Hematology Recent Labs  Lab 05/10/19 1811 05/11/19 0256 05/12/19 0309  WBC 20.0* 18.2* 14.9*  RBC 2.68* 2.49* 2.51*  HGB 8.0* 7.6* 7.7*  HCT 24.8* 23.3* 23.4*  MCV 92.5 93.6 93.2  MCH 29.9 30.5 30.7  MCHC 32.3 32.6 32.9  RDW 14.1 14.1 14.1  PLT 172 166 189    BNPNo results for input(s): BNP, PROBNP in the last 168 hours.   DDimer No results for input(s): DDIMER in the last 168 hours.   Radiology    Dg Chest 2 View  Result Date: 05/12/2019 CLINICAL DATA:  Follow-up atelectasis EXAM: CHEST - 2 VIEW COMPARISON:  05/11/2019 FINDINGS: Previous median sternotomy and CABG. Left chest tube has been removed. No pneumothorax. Right chest is clear. Persistent left effusion and left lower lobe atelectasis. IMPRESSION: Left chest tube removed. No pneumothorax. Persistent left lower lobe atelectasis. Possible small left effusion. Electronically Signed    By: Nelson Chimes M.D.   On: 05/12/2019 07:46    Cardiac Studies   Multivessel coronary artery disease on cardiac catheterization 05/07/2019.  Patient Profile     73 y.o. female with diabetes COPD non-ST elevation myocardial infarction peripheral arterial disease hypertension with severe coronary artery disease underwent bypass  Assessment & Plan    Coronary artery disease -Status post CABG.  Secondary prevention. -Clopidogrel back on- suggest considering aspirin 81 mg a day to minimize bleeding risk -Beta-blocker currently on hold secondary to heart rate in the 50s  Non-ST elevation myocardial infarction - CABG revascularization.  Aspirin Plavix statin  Hyperlipidemia -Prior LDL 37.  Excellent.  COPD -Stable.  Peripheral arterial disease -Stable.  Plavix aspirin  Deconditioning - PT OT -states that she just started walking again with physical therapy.  Postop anemia - Hemoglobin 7.7    For questions or updates, please contact [facility] Please consult [email or web address] for contact info under        Signed, Candee Furbish, MD  05/13/2019, 9:34 AM

## 2019-05-13 NOTE — Progress Notes (Signed)
[id number] Came to see pt to offer to walk. Pt stated too tired. Pt had dropped water on her front gown and thought something like electricity had gone down her front. Helped pt get drier. Pt's RN stated took two staff to get off BSC. Will follow PT's progress with pt. Will continue to follow. Graylon Good RN BSN 05/13/2019 3:11 PM

## 2019-05-13 NOTE — Progress Notes (Addendum)
      [address]       [place] [address]             [phone]        4 Days Post-Op Procedure(s) (LRB): CORONARY ARTERY BYPASS GRAFTING (CABG) times Three using Left Internal Mammary and Right Greater Saphenous Vein (N/A) TRANSESOPHAGEAL ECHOCARDIOGRAM (TEE) (N/A)  Subjective: Patient "sleeping hard" this am. Nurse reports she was up watching tv late into this am.  Objective: Vital signs in last 24 hours: Temp:  [98.2 F (36.8 C)-98.6 F (37 C)] 98.6 F (37 C) (07/06 0452) Pulse Rate:  [63-82] 63 (07/06 0452) Cardiac Rhythm: Normal sinus rhythm (07/05 2000) Resp:  [13-19] 13 (07/06 0452) BP: (128-149)/(56-80) 128/80 (07/06 0452) SpO2:  [95 %-97 %] 95 % (07/06 0452) Weight:  [96.7 kg] 96.7 kg (07/06 0452)  Pre op weight 91 kg Current Weight  05/13/19 96.7 kg      Intake/Output from previous day: 07/05 0701 - 07/06 0700 In: 480 [P.O.:480] Out: 2300 [Urine:2300]   Physical Exam:  Cardiovascular: Slightly bradycardic Pulmonary: Slightly diminished bibasilar breath sounds Abdomen: Soft, non tender, bowel sounds present. Extremities: Mild bilateral lower extremity edema. Chronic venous stasis changes ankles distally. Right upper arm with +++ ecchymosis Wounds: Clean and dry.  No erythema or signs of infection.  Lab Results: CBC: Recent Labs    05/11/19 0256 05/12/19 0309  WBC 18.2* 14.9*  HGB 7.6* 7.7*  HCT 23.3* 23.4*  PLT 166 189   BMET:  Recent Labs    05/11/19 0256 05/12/19 0309  NA 134* 133*  K 4.4 3.9  CL 107 103  CO2 20* 19*  GLUCOSE 167* 225*  BUN 21 23  CREATININE 1.20* 1.13*  CALCIUM 8.2* 8.2*    PT/INR:  Lab Results  Component Value Date   INR 1.3 (H) 05/11/2019   INR 1.6 (H) 05/09/2019   INR 1.2 05/08/2019   ABG:  INR: Will add last result for INR, ABG once components are confirmed Will add last 4 CBG results once components are confirmed  Assessment/Plan:  1. CV - S/p NSTEMI. SR in the 50-60's. On  DAPT, Lopressor 12.5 mg bid. Will hold Lopressor for now and monitor BP/HR 2.  Pulmonary - On room air. Encourage incentive spirometer. 3. Volume Overload - On Lasix 40 mg daily 4.  Acute blood loss anemia - H and H yesterday 7.7 23.4 5. DM-CBGs 191/153/142. On Insulin and Linagliptin. Will increase Insulin for better control. Pre op HGA1C 7.4. He will require medical follow up after discharge. 6. Right upper arm hematoma-monitor 7. Deconditioned PT/OT 8. Hope to discharge in next few days  Sharalyn Ink [facility] 05/13/2019,7:11 AM [phone]   Looks good this am Continue PT and pulm toilet dispo planning  [place]

## 2019-05-13 NOTE — Anesthesia Postprocedure Evaluation (Signed)
Anesthesia Post Note  Patient: Stephanie [name]  Procedure(s) Performed: CORONARY ARTERY BYPASS GRAFTING (CABG) times Three using Left Internal Mammary and Right Greater Saphenous Vein (N/A Chest) TRANSESOPHAGEAL ECHOCARDIOGRAM (TEE) (N/A )     Patient location during evaluation: ICU Anesthesia Type: General Level of consciousness: awake Pain management: pain level controlled Vital Signs Assessment: post-procedure vital signs reviewed and stable Respiratory status: spontaneous breathing, nonlabored ventilation, respiratory function stable and patient connected to nasal cannula oxygen Cardiovascular status: blood pressure returned to baseline and stable Postop Assessment: no apparent nausea or vomiting Anesthetic complications: no Comments: Extubated POD#0, doing well.    Last Vitals:  Vitals:   05/12/19 1915 05/13/19 0452  BP: (!) 132/56 128/80  Pulse: 78 63  Resp: 14 13  Temp: 36.8 C 37 C  SpO2: 97% 95%    Last Pain:  Vitals:   05/13/19 0452  TempSrc: Oral  PainSc:                  Audry Pili

## 2019-05-13 NOTE — NC FL2 (Signed)
[place] LEVEL OF CARE SCREENING TOOL     IDENTIFICATION  Patient Name: Stephanie [name] Birthdate: 05-17-1946 Sex: female Admission Date (Current Location): 05/02/2019  [place] and [place] Number:  Oval Linsey [id number] [place] and Address:  [facility], [place] [address] [place], [place] [address]      Provider Number: [id number]  Attending Physician Name and Address:  Lajuana Matte, MD  Relative Name and Phone Number:  Julius Bowels; contact; [phone]    Current Level of Care: Hospital Recommended Level of Care: [place] Prior Approval Number:    Date Approved/Denied:   PASRR Number: [id number] (no expiration)  Discharge Plan: SNF    Current Diagnoses: Patient Active Problem List   Diagnosis Date Noted  . S/P CABG x 3 05/09/2019  . Pressure injury of skin 05/03/2019  . NSTEMI (non-ST elevated myocardial infarction) ([place]) 05/02/2019  . Acute on chronic respiratory failure with hypoxia ([place]) 05/02/2019  . Leukocytosis 05/02/2019  . Acute hypoxemic respiratory failure ([place]) 05/02/2019  . COPD (chronic obstructive pulmonary disease) ([place])   . Depression   . Diabetes mellitus without complication ([place])   . HTN (hypertension)     Orientation RESPIRATION BLADDER Height & Weight     Self, Time, Situation, Place  Normal Continent, External catheter Weight: 213 lb 3 oz (96.7 kg) Height:  5\' 6"  (167.6 cm)  BEHAVIORAL SYMPTOMS/MOOD NEUROLOGICAL BOWEL NUTRITION STATUS      Continent Diet(see discharge summary)  AMBULATORY STATUS COMMUNICATION OF NEEDS Skin   Extensive Assist Verbally PU Stage and Appropriate Care     PU Stage 3 Dressing: (on left foot with santyl and gauze dressings to be changed daily) PU Stage 4 Dressing: (**unstageable** PU on left foot laterally with gauze and santyl for daily changes)               Personal Care Assistance Level of Assistance  Feeding, Dressing, Bathing  Bathing Assistance: Maximum assistance Feeding assistance: Limited assistance Dressing Assistance: Maximum assistance     Functional Limitations Info  Sight, Hearing, Speech Sight Info: Adequate Hearing Info: Adequate Speech Info: Adequate    SPECIAL CARE FACTORS FREQUENCY  PT (By licensed PT), OT (By licensed OT)     PT Frequency: 5x week OT Frequency: 5x week            Contractures Contractures Info: Not present    Additional Factors Info  Code Status, Allergies, Insulin Sliding Scale, Psychotropic Code Status Info: Full Code Allergies Info: Penicillins Psychotropic Info: PARoxetine (PAXIL-CR) 24 hr tablet 37.5 mg daily PO Insulin Sliding Scale Info: insulin aspart (novoLOG) injection 0-15 Units 3x daily with meals; insulin aspart (novoLOG) injection 3 Units 3x daily with meals; insulin glargine (LANTUS) injection 18 Units 2x daily       Current Medications (05/13/2019):  This is the current hospital active medication list Current Facility-Administered Medications  Medication Dose Route Frequency Provider Last Rate Last Dose  . 0.9 %  sodium chloride infusion  250 mL Intravenous PRN Melrose Nakayama, MD      . acetaminophen (TYLENOL) tablet 1,000 mg  1,000 mg Oral Q6H Melrose Nakayama, MD   1,000 mg at 05/13/19 5366   Or  . acetaminophen (TYLENOL) solution 1,000 mg  1,000 mg Per Tube Q6H Melrose Nakayama, MD      . alum & mag hydroxide-simeth (MAALOX/MYLANTA) 200-200-20 MG/5ML suspension 15 mL  15 mL Oral Q6H PRN Melrose Nakayama, MD      .  aspirin EC tablet 325 mg  325 mg Oral Daily Loreli SlotHendrickson, Steven C, MD   325 mg at 05/13/19 [id number]   Or  . aspirin chewable tablet 324 mg  324 mg Per Tube Daily Loreli SlotHendrickson, Steven C, MD      . bisacodyl (DULCOLAX) EC tablet 10 mg  10 mg Oral Daily Loreli SlotHendrickson, Steven C, MD   10 mg at 05/13/19 [id number]   Or  . bisacodyl (DULCOLAX) suppository 10 mg  10 mg Rectal Daily Loreli SlotHendrickson, Steven C, MD      . Chlorhexidine  Gluconate Cloth 2 % PADS 6 each  6 each Topical Daily Loreli SlotHendrickson, Steven C, MD   6 each at 05/11/19 1545  . clopidogrel (PLAVIX) tablet 75 mg  75 mg Oral Daily Loreli SlotHendrickson, Steven C, MD   75 mg at 05/13/19 0853  . collagenase (SANTYL) ointment   Topical Daily Loreli SlotHendrickson, Steven C, MD      . docusate sodium (COLACE) capsule 200 mg  200 mg Oral Daily Loreli SlotHendrickson, Steven C, MD   200 mg at 05/13/19 0853  . enoxaparin (LOVENOX) injection 40 mg  40 mg Subcutaneous QHS Loreli SlotHendrickson, Steven C, MD   40 mg at 05/12/19 2231  . furosemide (LASIX) tablet 40 mg  40 mg Oral Daily Loreli SlotHendrickson, Steven C, MD   40 mg at 05/13/19 0853  . gabapentin (NEURONTIN) tablet 600 mg  600 mg Oral Q8H Loreli SlotHendrickson, Steven C, MD   600 mg at 05/13/19 1329  . insulin aspart (novoLOG) injection 0-15 Units  0-15 Units Subcutaneous TID WC Loreli SlotHendrickson, Steven C, MD   3 Units at 05/13/19 1330  . insulin aspart (novoLOG) injection 3 Units  3 Units Subcutaneous TID WC Jari Favreonte, Tessa N, PA-C   3 Units at 05/13/19 1329  . insulin glargine (LANTUS) injection 18 Units  18 Units Subcutaneous BID Ardelle BallsZimmerman, Donielle M, [place]PA-C   18 Units at 05/13/19 1103  . ipratropium-albuterol (DUONEB) 0.5-2.5 (3) MG/3ML nebulizer solution 3 mL  3 mL Inhalation Q6H PRN Loreli SlotHendrickson, Steven C, MD      . linagliptin (TRADJENTA) tablet 5 mg  5 mg Oral Daily Loreli SlotHendrickson, Steven C, MD   5 mg at 05/13/19 0854  . magnesium hydroxide (MILK OF MAGNESIA) suspension 30 mL  30 mL Oral Daily PRN Loreli SlotHendrickson, Steven C, MD      . MEDLINE mouth rinse  15 mL Mouth Rinse BID Loreli SlotHendrickson, Steven C, MD   15 mL at 05/13/19 0857  . metoprolol tartrate (LOPRESSOR) injection 2.5-5 mg  2.5-5 mg Intravenous Q2H PRN Loreli SlotHendrickson, Steven C, MD      . moving right along book   Does not apply Once Loreli SlotHendrickson, Steven C, MD      . ondansetron [facility](ZOFRAN) injection 4 mg  4 mg Intravenous Q6H PRN Loreli SlotHendrickson, Steven C, MD      . oxyCODONE (Oxy IR/ROXICODONE) immediate release tablet 5-10 mg  5-10 mg  Oral Q3H PRN Loreli SlotHendrickson, Steven C, MD   5 mg at 05/12/19 1106  . pantoprazole (PROTONIX) EC tablet 40 mg  40 mg Oral Daily Loreli SlotHendrickson, Steven C, MD   40 mg at 05/13/19 0853  . PARoxetine (PAXIL-CR) 24 hr tablet 37.5 mg  37.5 mg Oral Daily Loreli SlotHendrickson, Steven C, MD   37.5 mg at 05/13/19 1103  . potassium chloride (K-DUR) CR tablet 20 mEq  20 mEq Oral Daily Loreli SlotHendrickson, Steven C, MD   20 mEq at 05/13/19 0854  . rosuvastatin (CRESTOR) tablet 10 mg  10 mg Oral QHS Loreli SlotHendrickson, Steven C,  MD   10 mg at 05/12/19 2232  . sodium chloride flush (NS) 0.9 % injection 3 mL  3 mL Intravenous Q12H Loreli SlotHendrickson, Steven C, MD   3 mL at 05/13/19 0858  . sodium chloride flush (NS) 0.9 % injection 3 mL  3 mL Intravenous PRN Loreli SlotHendrickson, Steven C, MD   3 mL at 05/11/19 2133  . SUMAtriptan (IMITREX) tablet 50 mg  50 mg Oral Daily PRN Loreli SlotHendrickson, Steven C, MD      . traMADol Janean Sark(ULTRAM) tablet 50-100 mg  50-100 mg Oral Q4H PRN Loreli SlotHendrickson, Steven C, MD   50 mg at 05/13/19 1111     Discharge Medications: Please see discharge summary for a list of discharge medications.  Relevant Imaging Results:  Relevant Lab Results:   Additional Information SS#[id number]  Kailer Heindel H Proctor[place]se, [place]CSWA

## 2019-05-13 NOTE — Care Management Important Message (Signed)
Important Message  Patient Details  Name: Stephanie [name] MRN: [id number] Date of Birth: 26-Mar-1946   Medicare Important Message Given:  Yes     [name] 05/13/2019, 1:09 PM

## 2019-05-13 NOTE — Evaluation (Signed)
Physical Therapy Evaluation Patient Details Name: Stephanie [name] MRN: [id number] DOB: 1946/07/21 Today's Date: 05/13/2019   History of Present Illness  73 yo admitted to [facility] 6/25 with NSTEMI then tranferred to [facility] s/p CABG x 3 on 7/2. PMhx: CHF, UTI, Rt knee OA, HTN, COPD, PVD, DM  Clinical Impression  Pt supine on arrival slow to respond with ability to state she is in the hospital and that she had surgery. Pt reports being a long term SNF resident at [facility] since Feb with plans to return. Pt reports she walks short distances with rollator at baseline but unable to clarify distance. Pt with decreased strength, balance, cognition, mobility and gait who will benefit from acute therapy to maximize mobility, function and safety to decrease burden of care.   HR 61-80 with activity    Follow Up Recommendations SNF;Supervision/Assistance - 24 hour    Equipment Recommendations  None recommended by PT    Recommendations for Other Services       Precautions / Restrictions Precautions Precautions: Fall;Sternal Precaution Booklet Issued: Yes (comment) Restrictions Weight Bearing Restrictions: Yes RUE Weight Bearing: Non weight bearing RLE Weight Bearing: Non weight bearing      Mobility  Bed Mobility Overal bed mobility: Needs Assistance Bed Mobility: Rolling;Sidelying to Sit Rolling: Mod assist Sidelying to sit: Max assist       General bed mobility comments: mod cues, increased time and physical assist to roll to left to exit bed with multimodal cues to get legs off of bed and max assist to elevate trunk from surface  Transfers Overall transfer level: Needs assistance   Transfers: Sit to/from Stand Sit to Stand: Mod assist         General transfer comment: mod assist to rise from surface with left knee blocked, use of belt and max multimodal cues for hand placement on thighs and precautions. Pt not following cues to sit with physical assist to block RUE from  reaching back to chair despite cues  Ambulation/Gait Ambulation/Gait assistance: Mod assist Gait Distance (Feet): 12 Feet Assistive device: Rolling walker (2 wheeled) Gait Pattern/deviations: Shuffle;Trunk flexed   Gait velocity interpretation: <1.8 ft/sec, indicate of risk for recurrent falls General Gait Details: cues for sequence with physical assist to direct and advance RW. Cues for posture and increased stride with pt reporting fatigue with limited gait.  Stairs            Wheelchair Mobility    Modified Rankin (Stroke Patients Only)       Balance Overall balance assessment: Needs assistance Sitting-balance support: Bilateral upper extremity supported;Feet supported Sitting balance-Leahy Scale: Fair     Standing balance support: Bilateral upper extremity supported Standing balance-Leahy Scale: Poor Standing balance comment: reliant on bil UE support in standing                             Pertinent Vitals/Pain Pain Assessment: No/denies pain    Home Living Family/patient expects to be discharged to:: Skilled nursing facility                 Additional Comments: pt recently moved to [facility]    Prior Function Level of Independence: Needs assistance   Gait / Transfers Assistance Needed: walks with RW or cane  ADL's / Homemaking Assistance Needed: pt reports she is able to dress and bathe on her own, staff does cooking and cleaning        Hand Dominance  Extremity/Trunk Assessment   Upper Extremity Assessment Upper Extremity Assessment: Generalized weakness    Lower Extremity Assessment Lower Extremity Assessment: Generalized weakness    Cervical / Trunk Assessment Cervical / Trunk Assessment: Kyphotic  Communication   Communication: No difficulties  Cognition Arousal/Alertness: Awake/alert Behavior During Therapy: Flat affect Overall Cognitive Status: Impaired/Different from baseline Area of Impairment: Problem  solving;Following commands;Safety/judgement;Memory                     Memory: Decreased short-term memory;Decreased recall of precautions Following Commands: Follows one step commands inconsistently;Follows one step commands with increased time Safety/Judgement: Decreased awareness of safety;Decreased awareness of deficits   Problem Solving: Slow processing;Difficulty sequencing;Requires verbal cues;Requires tactile cues General Comments: Pt able to recall "move in tube" after education but could not apply appropriately      General Comments      Exercises General Exercises - Lower Extremity Long Arc Quad: AROM;Both;10 reps;Seated Hip Flexion/Marching: AROM;Both;Seated;10 reps   Assessment/Plan    PT Assessment Patient needs continued PT services  PT Problem List Decreased strength;Decreased mobility;Decreased safety awareness;Decreased activity tolerance;Decreased balance;Decreased knowledge of use of DME;Cardiopulmonary status limiting activity;Decreased cognition;Decreased coordination;Decreased knowledge of precautions;Decreased range of motion       PT Treatment Interventions DME instruction;Therapeutic activities;Gait training;Therapeutic exercise;Cognitive remediation;Patient/family education;Functional mobility training;Balance training    PT Goals (Current goals can be found in the Care Plan section)  Acute Rehab PT Goals Patient Stated Goal: return to [facility] PT Goal Formulation: With patient Time For Goal Achievement: 05/27/19 Potential to Achieve Goals: Fair    Frequency Min 3X/week   Barriers to discharge Decreased caregiver support      Co-evaluation               AM-PAC PT "6 Clicks" Mobility  Outcome Measure Help needed turning from your back to your side while in a flat bed without using bedrails?: A Lot Help needed moving from lying on your back to sitting on the side of a flat bed without using bedrails?: Total Help needed moving to and  from a bed to a chair (including a wheelchair)?: A Lot Help needed standing up from a chair using your arms (e.g., wheelchair or bedside chair)?: A Lot Help needed to walk in hospital room?: A Lot Help needed climbing 3-5 steps with a railing? : Total 6 Click Score: 10    End of Session Equipment Utilized During Treatment: Gait belt Activity Tolerance: Patient tolerated treatment well Patient left: in chair;with call bell/phone within reach;with chair alarm set Nurse Communication: Mobility status;Precautions PT Visit Diagnosis: Other abnormalities of gait and mobility (R26.89);Muscle weakness (generalized) (M62.81);Unsteadiness on feet (R26.81)    Time: [id number] PT Time Calculation (min) (ACUTE ONLY): 24 min   Charges:   PT Evaluation $PT Eval Moderate Complexity: 1 Mod PT Treatments $Therapeutic Activity: 8-22 mins        Mykeal Carrick Abner Greenspanabor Aarya Robinson, PT Acute Rehabilitation Services Pager: [phone] Office: [phone]   Juliya Magill B Keante Urizar 05/13/2019, 12:09 PM

## 2019-05-13 NOTE — TOC Initial Note (Signed)
Transition of Care [facility]) - Initial/Assessment Note    Patient Details  Name: Stephanie [name] MRN: [id number] Date of Birth: 06/29/1946  [facility] CM/SW Contact:    Gelene Mink, [place] Phone Number: 05/13/2019, 5:13 PM  Clinical Narrative:                  CSW met with the patient at bedside. The patient was resting but was agreeable to speak with the CSW. CSW introduced herself and explained her role. The patient was agreeable to going back to [place]. She had no questions or concerns.   CSW will continue to follow.  Expected Discharge Plan: Skilled Nursing Facility Barriers to Discharge: Continued Medical Work up   Patient Goals and CMS Choice   CMS Medicare.gov Compare Post Acute Care list provided to:: Other (Comment Required)(Pt will return back to [place]) Choice offered to / list presented to : NA  Expected Discharge Plan and Services Expected Discharge Plan: [place] In-house Referral: Clinical Social Work Discharge Planning Services: NA Post Acute Care Choice: [place] Living arrangements for the past 2 months: Single Family Home                 DME Arranged: N/A DME Agency: NA       HH Arranged: NA [place] Agency: NA        Prior Living Arrangements/Services Living arrangements for the past 2 months: [place] Lives with:: Self Patient language and need for interpreter reviewed:: No Do you feel safe going back to the place where you live?: Yes      Need for Family Participation in Patient Care: Yes (Comment) Care giver support system in place?: Yes (comment)   Criminal Activity/Legal Involvement Pertinent to Current Situation/Hospitalization: No - Comment as needed  Activities of Daily Living Home Assistive Devices/Equipment: None ADL Screening (condition at time of admission) Patient's cognitive ability adequate to safely complete daily activities?: No Is the patient deaf or have difficulty  hearing?: No Does the patient have difficulty seeing, even when wearing glasses/contacts?: No Does the patient have difficulty concentrating, remembering, or making decisions?: No Patient able to express need for assistance with ADLs?: Yes Does the patient have difficulty dressing or bathing?: Yes Independently performs ADLs?: No Communication: Independent Dressing (OT): Needs assistance Is this a change from baseline?: Pre-admission baseline Grooming: Needs assistance Is this a change from baseline?: Pre-admission baseline Feeding: Independent Bathing: Needs assistance Is this a change from baseline?: Pre-admission baseline Toileting: Needs assistance Is this a change from baseline?: Pre-admission baseline In/Out Bed: Needs assistance Is this a change from baseline?: Pre-admission baseline Walks in Home: Needs assistance Is this a change from baseline?: Pre-admission baseline Does the patient have difficulty walking or climbing stairs?: Yes Weakness of Legs: Both Weakness of Arms/Hands: None  Permission Sought/Granted Permission sought to share information with : Case Manager Permission granted to share information with : Yes, Verbal Permission Granted  Share Information with NAME: Sunday Spillers  Permission granted to share info w AGENCY: [place] granted to share info w Relationship: Friend  Permission granted to share info w Contact Information: [phone]  Emotional Assessment Appearance:: Appears stated age Attitude/Demeanor/Rapport: Engaged Affect (typically observed): Calm Orientation: : Oriented to Self, Oriented to Place, Oriented to  Time, Oriented to Situation Alcohol / Substance Use: Not Applicable Psych Involvement: No (comment)  Admission diagnosis:  POSSIBLE COVID-19 VIRUS Patient Active Problem List   Diagnosis Date Noted  . S/P CABG  x 3 05/09/2019  . Pressure injury of skin 05/03/2019  . NSTEMI (non-ST elevated myocardial infarction) ([place]) 05/02/2019   . Acute on chronic respiratory failure with hypoxia ([place]) 05/02/2019  . Leukocytosis 05/02/2019  . Acute hypoxemic respiratory failure ([place]) 05/02/2019  . COPD (chronic obstructive pulmonary disease) ([place])   . Depression   . Diabetes mellitus without complication ([place])   . HTN (hypertension)    PCP:  Hedwig Morton, NP Pharmacy:  No Pharmacies Listed    Social Determinants of Health (SDOH) Interventions    Readmission Risk Interventions No flowsheet data found.

## 2019-05-14 LAB — CBC
HCT: 22.8 % — ABNORMAL LOW (ref 36.0–46.0)
Hemoglobin: 7.4 g/dL — ABNORMAL LOW (ref 12.0–15.0)
MCH: 30.5 pg (ref 26.0–34.0)
MCHC: 32.5 g/dL (ref 30.0–36.0)
MCV: 93.8 fL (ref 80.0–100.0)
Platelets: 258 10*3/uL (ref 150–400)
RBC: 2.43 MIL/uL — ABNORMAL LOW (ref 3.87–5.11)
RDW: 14.1 % (ref 11.5–15.5)
WBC: 11.1 10*3/uL — ABNORMAL HIGH (ref 4.0–10.5)
nRBC: 0 % (ref 0.0–0.2)

## 2019-05-14 LAB — GLUCOSE, CAPILLARY
Glucose-Capillary: 197 mg/dL — ABNORMAL HIGH (ref 70–99)
Glucose-Capillary: 186 mg/dL — ABNORMAL HIGH (ref 70–99)
Glucose-Capillary: 156 mg/dL — ABNORMAL HIGH (ref 70–99)
Glucose-Capillary: 169 mg/dL — ABNORMAL HIGH (ref 70–99)

## 2019-05-14 LAB — BASIC METABOLIC PANEL
Anion gap: 11 (ref 5–15)
BUN: 20 mg/dL (ref 8–23)
CO2: 22 mmol/L (ref 22–32)
Calcium: 8.4 mg/dL — ABNORMAL LOW (ref 8.9–10.3)
Chloride: 103 mmol/L (ref 98–111)
Creatinine, Ser: 1.02 mg/dL — ABNORMAL HIGH (ref 0.44–1.00)
GFR calc Af Amer: >60 mL/min (ref >60)
GFR calc non Af Amer: 55 mL/min — ABNORMAL LOW (ref >60)
Glucose, Bld: 217 mg/dL — ABNORMAL HIGH (ref 70–99)
Potassium: 4.2 mmol/L (ref 3.5–5.1)
Sodium: 136 mmol/L (ref 135–145)

## 2019-05-14 MED ORDER — INSULIN GLARGINE 100 UNIT/ML ~~LOC~~ SOLN
22.0000 [IU] | Freq: Two times a day (BID) | SUBCUTANEOUS | Status: DC
  Administered 2019-05-14 – 2019-05-15 (×3): 22 [IU] via SUBCUTANEOUS
  Filled 2019-05-14 (×5): qty 0.22

## 2019-05-14 MED ORDER — ASPIRIN 325 MG PO TBEC: 325.0000 mg | DELAYED_RELEASE_TABLET | Freq: Every day | ORAL | 0 refills | Status: DC

## 2019-05-14 MED ORDER — FERROUS SULFATE 325 (65 FE) MG PO TABS: 325.0000 mg | ORAL_TABLET | Freq: Every day | ORAL | 3 refills | Status: AC

## 2019-05-14 MED ORDER — FERROUS SULFATE 325 (65 FE) MG PO TABS
325.0000 mg | ORAL_TABLET | Freq: Every day | ORAL | Status: DC
  Administered 2019-05-14 – 2019-05-15 (×2): 325 mg via ORAL
  Filled 2019-05-14 (×2): qty 1

## 2019-05-14 MED ORDER — METOPROLOL TARTRATE 25 MG PO TABS
25.0000 mg | ORAL_TABLET | Freq: Two times a day (BID) | ORAL | Status: DC
  Administered 2019-05-14 – 2019-05-15 (×3): 25 mg via ORAL
  Filled 2019-05-14 (×3): qty 1

## 2019-05-14 MED ORDER — INSULIN ASPART 100 UNIT/ML ~~LOC~~ SOLN
6.0000 [IU] | Freq: Three times a day (TID) | SUBCUTANEOUS | Status: DC
  Administered 2019-05-14 – 2019-05-15 (×5): 6 [IU] via SUBCUTANEOUS

## 2019-05-14 MED FILL — Electrolyte-R (PH 7.4) Solution: INTRAVENOUS | Qty: 5000 | Status: AC

## 2019-05-14 MED FILL — Calcium Chloride Inj 10%: INTRAVENOUS | Qty: 10 | Status: AC

## 2019-05-14 MED FILL — Sodium Bicarbonate IV Soln 8.4%: INTRAVENOUS | Qty: 50 | Status: AC

## 2019-05-14 MED FILL — Albumin, Human Inj 5%: INTRAVENOUS | Qty: 250 | Status: AC

## 2019-05-14 MED FILL — Lidocaine HCl Local Preservative Free (PF) Inj 2%: INTRAMUSCULAR | Qty: 15 | Status: AC

## 2019-05-14 MED FILL — Heparin Sodium (Porcine) Inj 1000 Unit/ML: INTRAMUSCULAR | Qty: 30 | Status: AC

## 2019-05-14 MED FILL — Sodium Chloride IV Soln 0.9%: INTRAVENOUS | Qty: 4000 | Status: AC

## 2019-05-14 MED FILL — Potassium Chloride Inj 2 mEq/ML: INTRAVENOUS | Qty: 40 | Status: AC

## 2019-05-14 MED FILL — Heparin Sodium (Porcine) Inj 1000 Unit/ML: INTRAMUSCULAR | Qty: 40 | Status: AC

## 2019-05-14 NOTE — Discharge Instructions (Signed)
We ask the SNF to please do the following: 1. Please obtain vital signs at least one time daily 2.Please weigh the patient daily. If he or she continues to gain weight or develops lower extremity edema, contact the office at [phone]. 3. Ambulate patient at least three times daily and please use sternal precautions.  Discharge Instructions:  1. You may shower, please wash incisions daily with soap and water and keep dry.  If you wish to cover wounds with dressing you may do so but please keep clean and change daily.  No tub baths or swimming until incisions have completely healed.  If your incisions become red or develop any drainage please call our office at [phone]  2. No Driving until cleared by [place] office and you are no longer using narcotic pain medications  3. Monitor your weight daily.. Please use the same scale and weigh at same time... If you gain 5-10 lbs in 48 hours with associated lower extremity swelling, please contact our office at [phone]  4. Fever of 101.5 for at least 24 hours with no source, please contact our office at [phone]  5. Activity- up as tolerated, please walk at least 3 times per day.  Avoid strenuous activity, no lifting, pushing, or pulling with your arms over 8-10 lbs for a minimum of 6 weeks  6. If any questions or concerns arise, please do not hesitate to contact our office at [phone]

## 2019-05-14 NOTE — Progress Notes (Addendum)
      [address]       [place] [address]             [phone]        5 Days Post-Op Procedure(s) (LRB): CORONARY ARTERY BYPASS GRAFTING (CABG) times Three using Left Internal Mammary and Right Greater Saphenous Vein (N/A) TRANSESOPHAGEAL ECHOCARDIOGRAM (TEE) (N/A)  Subjective: Patient just starting to wake up. She has no specific complaints this am  Objective: Vital signs in last 24 hours: Temp:  [98.2 F (36.8 C)] 98.2 F (36.8 C) (07/07 0409) Pulse Rate:  [73-88] 88 (07/07 0409) Cardiac Rhythm: Normal sinus rhythm (07/06 1900) Resp:  [13-18] 15 (07/07 0409) BP: (127-148)/(62-102) 127/102 (07/07 0409) SpO2:  [91 %-100 %] 96 % (07/07 0409) Weight:  [95.6 kg] 95.6 kg (07/07 0409)  Pre op weight 91 kg Current Weight  05/14/19 95.6 kg      Intake/Output from previous day: 07/06 0701 - 07/07 0700 In: 100 [P.O.:100] Out: 800 [Urine:800]   Physical Exam:  Cardiovascular: Slightly bradycardic Pulmonary: Slightly diminished left base Abdomen: Soft, non tender, bowel sounds present. Extremities: Mild bilateral lower extremity edema. Chronic venous stasis changes ankles distally. Right upper arm with +++ ecchymosis (stable) Wounds: Clean and dry.  No erythema or signs of infection.  Lab Results: CBC: Recent Labs    05/12/19 0309  WBC 14.9*  HGB 7.7*  HCT 23.4*  PLT 189   BMET:  Recent Labs    05/12/19 0309  NA 133*  K 3.9  CL 103  CO2 19*  GLUCOSE 225*  BUN 23  CREATININE 1.13*  CALCIUM 8.2*    PT/INR:  Lab Results  Component Value Date   INR 1.3 (H) 05/11/2019   INR 1.6 (H) 05/09/2019   INR 1.2 05/08/2019   ABG:  INR: Will add last result for INR, ABG once components are confirmed Will add last 4 CBG results once components are confirmed  Assessment/Plan:  1. CV - S/p NSTEMI. SR in the 70's this am. On DAPT. I stopped Lopressor yesterday secondary to bradycardia. Dr. Kipp Brood restarted Lopressor 25 mg bid. 2.   Pulmonary - On room air. Encourage incentive spirometer. 3. Volume Overload - On Lasix 40 mg daily 4.  Acute blood loss anemia - H and H yesterday 7.7 23.4 5. DM-CBGs 198/218/197. On Insulin and Linagliptin. Will increase Insulin for better control. Pre op HGA1C 7.4. He will require medical follow up after discharge. 6. Right upper arm hematoma-appears stable 7. Deconditioned- PT/OT 8. Will discharge to SNF once bed available  Donielle M ZimmermanPA-C 05/14/2019,7:09 AM [phone]  patient examined and medical record reviewed,agree with above note.recheck Hb in am Tharon Aquas Trigt III 05/14/2019

## 2019-05-14 NOTE — Progress Notes (Addendum)
Attempted to ambulate pt x2 today. Pt stated that she was too weak to walk and required 2 person assist and walker to transfer bed to chair and chair to [facility] x3.   Clyde Canterbury, RN

## 2019-05-15 LAB — CBC
HCT: 26.2 % — ABNORMAL LOW (ref 36.0–46.0)
Hemoglobin: 8.4 g/dL — ABNORMAL LOW (ref 12.0–15.0)
MCH: 30.4 pg (ref 26.0–34.0)
MCHC: 32.1 g/dL (ref 30.0–36.0)
MCV: 94.9 fL (ref 80.0–100.0)
Platelets: 310 10*3/uL (ref 150–400)
RBC: 2.76 MIL/uL — ABNORMAL LOW (ref 3.87–5.11)
RDW: 14.5 % (ref 11.5–15.5)
WBC: 11.7 10*3/uL — ABNORMAL HIGH (ref 4.0–10.5)
nRBC: 0 % (ref 0.0–0.2)

## 2019-05-15 LAB — GLUCOSE, CAPILLARY
Glucose-Capillary: 170 mg/dL — ABNORMAL HIGH (ref 70–99)
Glucose-Capillary: 160 mg/dL — ABNORMAL HIGH (ref 70–99)
Glucose-Capillary: 157 mg/dL — ABNORMAL HIGH (ref 70–99)

## 2019-05-15 LAB — SARS CORONAVIRUS 2 BY RT PCR (HOSPITAL ORDER, PERFORMED IN ~~LOC~~ HOSPITAL LAB): SARS Coronavirus 2: NEGATIVE

## 2019-05-15 MED ORDER — TRAMADOL HCL 50 MG PO TABS: 50.0000 mg | ORAL_TABLET | Freq: Four times a day (QID) | ORAL | 0 refills | Status: AC | PRN

## 2019-05-15 MED ORDER — FUROSEMIDE 40 MG PO TABS: 40.0000 mg | ORAL_TABLET | Freq: Every day | ORAL | 0 refills | Status: DC

## 2019-05-15 MED ORDER — LISINOPRIL 10 MG PO TABS
20.0000 mg | ORAL_TABLET | Freq: Every day | ORAL | Status: DC
  Administered 2019-05-15: 20 mg via ORAL
  Filled 2019-05-15: qty 2

## 2019-05-15 MED ORDER — ASPIRIN EC 81 MG PO TBEC: 81.0000 mg | DELAYED_RELEASE_TABLET | Freq: Every day | ORAL | Status: DC

## 2019-05-15 MED ORDER — LISINOPRIL 20 MG PO TABS: 20.0000 mg | ORAL_TABLET | Freq: Every day | ORAL | Status: AC

## 2019-05-15 MED ORDER — METOPROLOL TARTRATE 25 MG PO TABS: 25.0000 mg | ORAL_TABLET | Freq: Two times a day (BID) | ORAL | Status: AC

## 2019-05-15 MED ORDER — ASPIRIN 81 MG PO CHEW: 81.0000 mg | CHEWABLE_TABLET | Freq: Every day | ORAL | Status: DC

## 2019-05-15 MED ORDER — POTASSIUM CHLORIDE ER 20 MEQ PO TBCR: 20.0000 meq | EXTENDED_RELEASE_TABLET | Freq: Every day | ORAL | 0 refills | Status: DC

## 2019-05-15 MED ORDER — ASPIRIN 81 MG PO TBEC: 81.0000 mg | DELAYED_RELEASE_TABLET | Freq: Every day | ORAL | Status: AC

## 2019-05-15 NOTE — Care Management (Signed)
COVID test requested to facilitate placement

## 2019-05-15 NOTE — Progress Notes (Deleted)
Cardiology Office Note   Date:  05/15/2019   ID:  Stephanie [name] [name], [place] [birth year], MRN [id number]  PCP:  Levin ErpJones, Brittney M, NP  Cardiologist: Dr. Anne FuSkains, MD   No chief complaint on file.    History of Present Illness: Stephanie [name] is a 73 y.o. female who presents for post CABG follow-up.  [name] has a prior history of DM2, COPD, PVD, hypertension and depression.  She presented with complaints of worsening shortness of breath over a 3-day duration.  She was [facility] and found to have an ischemic EKG with an elevated troponin. [facility] for [facility]. She underwent a left heart cardiac catheterization on 05/07/2019 which demonstrated three-vessel coronary artery disease with preserved LV function in which TCTS was asked to evaluate for consideration of coronary artery bypass grafting.  Additionally, she had leukocytosis on presentation in which a CT of the abdomen and pelvis showed mild right-sided hydronephrosis as well as possible pyelonephritis and she was started on broad-spectrum IV antibiotics during that time.  Coronary bypass grafting x3 was performed 05/09/2019 without complication.  She had issues with anemia in the postoperative setting in which she required a postop transfusion.  Last hemoglobin prior to discharge was 8.4.  She was started on oral iron supplementation.  Her diabetes was noted to be fairly uncontrolled with a hemoglobin A1c of 7.4.  As she moved through her hospital course, she became more hypertensive and was started on lisinopril 20 mg daily on 05/15/2019.  New hospital medications include ASA 81, Plavix 75, ferrous sulfate 325, metoprolol 25.  She was discharged on a short course of p.o. Lasix 40 mg daily for 4 days then instructed to stop.      1. Coronary artery disease status post CABG 05/09/2019: -Stable, -Continue ASA, Plavix, beta-blocker -Sternal precautions reviewed, patient  understands -Cardiac rehabilitation   2. Non-ST elevation MI: -Status post CABG for revascularization -Continue ASA, Plavix, statin   3. HLD: -Last LDL, 37, currently at goal at <70 mg/DL -Continue statin  4. HTN: -Found to be moderately hypertensive during her hospital course -Placed on lisinopril 20 mg daily -Currently controlled on current regimen -We will obtain BMET  5. Postoperative anemia: -Patient required post op PRBC transfusion -Discharge hemoglobin, 8.4 -We will obtain CBC today -She was placed on ferrous sulfate 325 -Denies acute bleeding in stool or urine   Past Medical History:  Diagnosis Date   COPD (chronic obstructive pulmonary disease) (HCC)    Depression    Diabetes mellitus without complication (HCC)    HTN (hypertension)    Hyperlipidemia    PVD (peripheral vascular disease) (HCC)     Past Surgical History:  Procedure Laterality Date   ABDOMINOPLASTY Right 04/26/2019   Verbalized per patient    CORONARY ARTERY BYPASS GRAFT N/A 05/09/2019   Procedure: CORONARY ARTERY BYPASS GRAFTING (CABG) times Three using Left Internal Mammary and Right Greater Saphenous Vein;  Surgeon: Corliss SkainsLightfoot, Harrell O, MD;  Location: [facility] OR;  Service: Open Heart Surgery;  Laterality: N/A;   INTRAVASCULAR PRESSURE WIRE/FFR STUDY N/A 05/07/2019   Procedure: INTRAVASCULAR PRESSURE WIRE/FFR STUDY;  Surgeon: Runell GessBerry, Jonathan J, MD;  Location: [facility] INVASIVE CV LAB;  Service: Cardiovascular;  Laterality: N/A;   LEFT HEART CATH AND CORONARY ANGIOGRAPHY N/A 05/07/2019   Procedure: LEFT HEART CATH AND CORONARY ANGIOGRAPHY;  Surgeon: Runell GessBerry, Jonathan J, MD;  Location: [facility] INVASIVE CV LAB;  Service: Cardiovascular;  Laterality:  N/A;   TEE WITHOUT CARDIOVERSION N/A 05/09/2019   Procedure: TRANSESOPHAGEAL ECHOCARDIOGRAM (TEE);  Surgeon: Lajuana Matte, MD;  Location: [place];  Service: Open Heart Surgery;  Laterality: N/A;     No current facility-administered medications for this  visit.    Current Outpatient Medications  Medication Sig Dispense Refill   aspirin EC 325 MG EC tablet Take 1 tablet (325 mg total) by mouth daily. 30 tablet 0   ferrous sulfate 325 (65 FE) MG tablet Take 1 tablet (325 mg total) by mouth daily with breakfast. For one month then stop. If she develops constipation, may stop  3   furosemide (LASIX) 40 MG tablet Take 1 tablet (40 mg total) by mouth daily. For 4 days then stop. 4 tablet 0   lisinopril (ZESTRIL) 20 MG tablet Take 1 tablet (20 mg total) by mouth daily.     metoprolol tartrate (LOPRESSOR) 25 MG tablet Take 1 tablet (25 mg total) by mouth 2 (two) times daily.     potassium chloride 20 MEQ TBCR Take 20 mEq by mouth daily. For 4 days then stop. 4 tablet 0   traMADol (ULTRAM) 50 MG tablet Take 1 tablet (50 mg total) by mouth every 6 (six) hours as needed for moderate pain. 28 tablet 0   Facility-Administered Medications Ordered in Other Visits  Medication Dose Route Frequency Provider Last Rate Last Dose   0.9 %  sodium chloride infusion  250 mL Intravenous PRN Melrose Nakayama, MD       alum & mag hydroxide-simeth (MAALOX/MYLANTA) 200-200-20 MG/5ML suspension 15 mL  15 mL Oral Q6H PRN Melrose Nakayama, MD       aspirin EC tablet 325 mg  325 mg Oral Daily Melrose Nakayama, MD   325 mg at 05/15/19 6160   Or   aspirin chewable tablet 324 mg  324 mg Per Tube Daily Melrose Nakayama, MD       bisacodyl (DULCOLAX) EC tablet 10 mg  10 mg Oral Daily Melrose Nakayama, MD   10 mg at 05/13/19 7371   Or   bisacodyl (DULCOLAX) suppository 10 mg  10 mg Rectal Daily Melrose Nakayama, MD       Chlorhexidine Gluconate Cloth 2 % PADS 6 each  6 each Topical Daily Melrose Nakayama, MD   6 each at 05/11/19 1545   clopidogrel (PLAVIX) tablet 75 mg  75 mg Oral Daily Melrose Nakayama, MD   75 mg at 05/15/19 0626   collagenase (SANTYL) ointment   Topical Daily Melrose Nakayama, MD       docusate  sodium (COLACE) capsule 200 mg  200 mg Oral Daily Melrose Nakayama, MD   200 mg at 05/15/19 0830   enoxaparin (LOVENOX) injection 40 mg  40 mg Subcutaneous QHS Melrose Nakayama, MD   40 mg at 05/14/19 2215   ferrous sulfate tablet 325 mg  325 mg Oral Q breakfast Nani Skillern, PA-C   325 mg at 05/15/19 9485   furosemide (LASIX) tablet 40 mg  40 mg Oral Daily Melrose Nakayama, MD   40 mg at 05/15/19 0834   gabapentin (NEURONTIN) tablet 600 mg  600 mg Oral Q8H Melrose Nakayama, MD   600 mg at 05/15/19 0643   insulin aspart (novoLOG) injection 0-15 Units  0-15 Units Subcutaneous TID WC Melrose Nakayama, MD   3 Units at 05/15/19 4627   insulin aspart (novoLOG) injection 6 Units  6 Units Subcutaneous  TID WC Ardelle BallsZimmerman, Donielle M, PA-C   6 Units at 05/15/19 [id number]   insulin glargine (LANTUS) injection 22 Units  22 Units Subcutaneous BID Ardelle BallsZimmerman, Donielle M, [place]PA-C   22 Units at 05/15/19 [id number]   ipratropium-albuterol (DUONEB) 0.5-2.5 (3) MG/3ML nebulizer solution 3 mL  3 mL Inhalation Q6H PRN Loreli SlotHendrickson, Steven C, MD       linagliptin (TRADJENTA) tablet 5 mg  5 mg Oral Daily Loreli SlotHendrickson, Steven C, MD   5 mg at 05/15/19 0835   lisinopril (ZESTRIL) tablet 20 mg  20 mg Oral Daily Doree FudgeZimmerman, Donielle M, PA-C   20 mg at 05/15/19 [id number]   magnesium hydroxide (MILK OF MAGNESIA) suspension 30 mL  30 mL Oral Daily PRN Loreli SlotHendrickson, Steven C, MD       MEDLINE mouth rinse  15 mL Mouth Rinse BID Loreli SlotHendrickson, Steven C, MD   15 mL at 05/14/19 2219   metoprolol tartrate (LOPRESSOR) tablet 25 mg  25 mg Oral BID Corliss SkainsLightfoot, Harrell O, MD   25 mg at 05/15/19 [id number]   moving right along book   Does not apply Once Loreli SlotHendrickson, Steven C, MD       ondansetron [facility](ZOFRAN) injection 4 mg  4 mg Intravenous Q6H PRN Loreli SlotHendrickson, Steven C, MD       oxyCODONE (Oxy IR/ROXICODONE) immediate release tablet 5-10 mg  5-10 mg Oral Q3H PRN Loreli SlotHendrickson, Steven C, MD   5 mg at 05/12/19 1106   pantoprazole  (PROTONIX) EC tablet 40 mg  40 mg Oral Daily Loreli SlotHendrickson, Steven C, MD   40 mg at 05/15/19 0835   PARoxetine (PAXIL-CR) 24 hr tablet 37.5 mg  37.5 mg Oral Daily Loreli SlotHendrickson, Steven C, MD   37.5 mg at 05/15/19 0834   potassium chloride (K-DUR) CR tablet 20 mEq  20 mEq Oral Daily Loreli SlotHendrickson, Steven C, MD   20 mEq at 05/15/19 0834   rosuvastatin (CRESTOR) tablet 10 mg  10 mg Oral QHS Loreli SlotHendrickson, Steven C, MD   10 mg at 05/14/19 2215   sodium chloride flush (NS) 0.9 % injection 3 mL  3 mL Intravenous Q12H Loreli SlotHendrickson, Steven C, MD   3 mL at 05/14/19 2219   sodium chloride flush (NS) 0.9 % injection 3 mL  3 mL Intravenous PRN Loreli SlotHendrickson, Steven C, MD   3 mL at 05/11/19 2133   SUMAtriptan (IMITREX) tablet 50 mg  50 mg Oral Daily PRN Loreli SlotHendrickson, Steven C, MD       traMADol Janean Sark(ULTRAM) tablet 50-100 mg  50-100 mg Oral Q4H PRN Loreli SlotHendrickson, Steven C, MD   50 mg at 05/13/19 1111    Allergies:   Penicillins    Social History:  The patient  reports that she has quit smoking. She has never used smokeless tobacco. She reports previous alcohol use. She reports that she does not use drugs.   Family History:  The patient's ***family history includes Heart attack in her brother, father, and mother; High blood pressure in her father and mother.    ROS:  Please see the history of present illness.   Otherwise, review of systems are positive for {NONE DEFAULTED:18576::"none"}.   All other systems are reviewed and negative.    PHYSICAL EXAM: VS:  LMP  (LMP Unknown)  , BMI There is no height or weight on file to calculate BMI.   General: Well developed, well nourished, NAD Skin: Warm, dry, intact  Head: Normocephalic, atraumatic, sclera non-icteric, no xanthomas, clear, moist mucus membranes. Neck: Negative for carotid bruits. No JVD Lungs:Clear to  ausculation bilaterally. No wheezes, rales, or rhonchi. Breathing is unlabored. Cardiovascular: RRR with S1 S2. No murmurs, rubs, gallops, or LV heave  appreciated. Abdomen: Soft, non-tender, non-distended with normoactive bowel sounds. No hepatomegaly, No rebound/guarding. No obvious abdominal masses. MSK: Strength and tone appear normal for age. 5/5 in all extremities Extremities: No edema. No clubbing or cyanosis. DP/PT pulses 2+ bilaterally Neuro: Alert and oriented. No focal deficits. No facial asymmetry. MAE spontaneously. Psych: Responds to questions appropriately with normal affect.      EKG:  EKG {ACTION; IS/IS RUE:45409811}OT:21021397} ordered today. The ekg ordered today demonstrates ***   Recent Labs: 05/02/2019: ALT 28 05/10/2019: Magnesium 2.4 05/14/2019: BUN 20; Creatinine, Ser 1.02; Potassium 4.2; Sodium 136 05/15/2019: Hemoglobin 8.4; Platelets 310    Lipid Panel    Component Value Date/Time   CHOL 104 05/02/2019 2332   TRIG 145 05/02/2019 2332   HDL 38 (L) 05/02/2019 2332   CHOLHDL 2.7 05/02/2019 2332   VLDL 29 05/02/2019 2332   LDLCALC 37 05/02/2019 2332     Wt Readings from Last 3 Encounters:  05/15/19 206 lb 12.7 oz (93.8 kg)     Other studies Reviewed: Additional studies/ records that were reviewed today include:  Cardiac catheterization 05/07/2019:   Mid RCA lesion is 90% stenosed.  Ost Cx to Prox Cx lesion is 95% stenosed.  Ost LAD to Prox LAD lesion is 60% stenosed.  2nd Mrg lesion is 90% stenosed.  Ramus lesion is 60% stenosed.  [name] was admitted with heart failure with preserved LV function.  Troponins were fairly low and flat.  She is diabetic.  Coronary coronary angiography revealed three-vessel disease with high-grade ostial nondominant circumflex not amenable to PCI, high-grade mid dominant RCA stenosis and moderate segmental proximal LAD disease with positive FFR of 0.79.  Have reviewed with Dr. Eldridge DaceVaranasi and discussed with her attending, Dr. Elease HashimotoNahser.  We will get T CTS consult for revascularization.  The radial sheath was removed and a TR band was placed on the right wrist to achieve patent  hemostasis.  The femoral sheath was secured in place.  Echocardiogram 05/03/2019:  1. The left ventricle has normal systolic function with an ejection fraction of 60-65%. The cavity size was normal. There is mildly increased left ventricular wall thickness. Left ventricular diastolic Doppler parameters are consistent with impaired  relaxation. No evidence of left ventricular regional wall motion abnormalities.  2. The right ventricle has normal systolic function. The cavity was normal. There is no increase in right ventricular wall thickness.  3. Left atrial size was moderately dilated.  4. There is moderate mitral annular calcification present. No evidence of mitral valve stenosis. Trivial mitral regurgitation.  5. The aortic valve is tricuspid. Mild calcification of the aortic valve. No stenosis of the aortic valve.  6. The inferior vena cava was dilated in size with >50% respiratory variability. No complete TR doppler jet so unable to estimate PA systolic pressure.   ASSESSMENT AND PLAN:  1.  ***   Current medicines are reviewed at length with the patient today.  The patient {ACTIONS; HAS/DOES NOT HAVE:19233} concerns regarding medicines.  The following changes have been made:  {PLAN; NO CHANGE:13088:s}  Labs/ tests ordered today include: *** No orders of the defined types were placed in this encounter.    Disposition:   FU with *** in {gen number 9-14:782956}0-10:310397} {Days to years:10300}  Signed, Georgie ChardJill Lonny Eisen, NP  05/15/2019 8:39 AM    [facility] [address],  [place], [place]  [address] Phone: [phone]; Fax: [phone]

## 2019-05-15 NOTE — Progress Notes (Addendum)
      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 She has complaints of being tired.  Objective: Vital signs in last 24 hours: Temp:  [98.2 F (36.8 C)-98.5 F (36.9 C)] 98.5 F (36.9 C) (07/08 0421) Pulse Rate:  [70-76] 70 (07/08 0421) Cardiac Rhythm: Normal sinus rhythm;Bundle branch block (07/07 1900) Resp:  [14-17] 17 (07/08 0421) BP: (109-158)/(54-69) 158/61 (07/08 0421) SpO2:  [95 %-99 %] 99 % (07/08 0421) Weight:  [93.8 kg] 93.8 kg (07/08 0421)  Pre op weight 91 kg Current Weight  05/15/19 93.8 kg      Intake/Output from previous day: 07/07 0701 - 07/08 0700 In: 440 [P.O.:440] Out: -    Physical Exam:  Cardiovascular: RRR Pulmonary: Mostly clear Abdomen: Soft, non tender, bowel sounds present. Extremities:Trace bilateral lower extremity edema. Chronic venous stasis changes ankles distally. Right upper arm with +++ ecchymosis (stable) Wounds: Clean and dry.  No erythema or signs of infection.  Lab Results: CBC: Recent Labs    05/14/19 0742 05/15/19 0531  WBC 11.1* 11.7*  HGB 7.4* 8.4*  HCT 22.8* 26.2*  PLT 258 310   BMET:  Recent Labs    05/14/19 0742  NA 136  K 4.2  CL 103  CO2 22  GLUCOSE 217*  BUN 20  CREATININE 1.02*  CALCIUM 8.4*    PT/INR:  Lab Results  Component Value Date   INR 1.3 (H) 05/11/2019   INR 1.6 (H) 05/09/2019   INR 1.2 05/08/2019   ABG:  INR: Will add last result for INR, ABG once components are confirmed Will add last 4 CBG results once components are confirmed  Assessment/Plan:  1. CV - S/p NSTEMI. SR in the 60-70's this am and hypertensive. On DAPT and Lopressor 25 mg bid. Will start ACE for better BP control. 2.  Pulmonary - On room air.  Encourage incentive spirometer. 3. Volume Overload - On Lasix 40 mg daily. Will require several more days of Lasix after discharge as still above pre op weight. 4.  Acute blood loss anemia - H and H this am increased to 8.4 and 26.2. Continue ferrous sulfate. 5. DM-CBGs 156/169/170. On Insulin and Linagliptin. On Insulin for better control. Pre op HGA1C 7.4. He will require medical follow up after discharge. 6. Right upper arm hematoma-appears stable 7. Deconditioned- PT/OT 8. Will discharge to SNF once bed available  Viviano Bir M ZimmermanPA-C 05/15/2019,7:12 AM [phone]

## 2019-05-15 NOTE — TOC Transition Note (Signed)
Transition of Care [facility]) - CM/SW Discharge Note   Patient Details  Name: Stephanie [name] MRN: [id number] Date of Birth: 25-May-1946  [facility]) CM/SW Contact:  Gabrielle Dare Phone Number: 05/15/2019, 4:41 PM   Clinical Narrative:   Patient will Discharge To: [place] Date:05/15/2019 Family Notified: Left vm to contact [place], Julius Bowels [phone] Transport By: Corey Harold   Per MD patient ready for DC to [place]. . RN, patient, patient's family, and facility notified of DC. Assessment, Fl2/Pasrr, and Discharge Summary sent to facility. RN given number for report [phone], room 108). DC packet on chart. Ambulance transport requested for patient.   CSW signing off.  Reed Breech [facility] [phone]   Final next level of care: Skilled Nursing Facility Barriers to Discharge: No Barriers Identified   Patient Goals and CMS Choice   CMS Medicare.gov Compare Post Acute Care list provided to:: Other (Comment Required)(Pt will return back to [place]) Choice offered to / list presented to : NA  Discharge Placement              Patient chooses bed at: ([place]) Patient to be transferred to facility by: [place] Name of family member notified: Julius Bowels Patient and family notified of of transfer: 05/15/19  Discharge Plan and Services In-house Referral: Clinical Social Work Discharge Planning Services: NA Post Acute Care Choice: [place]          DME Arranged: N/A DME Agency: NA       HH Arranged: NA HH Agency: NA        Social Determinants of Health (SDOH) Interventions     Readmission Risk Interventions No flowsheet data found.

## 2019-05-15 NOTE — Progress Notes (Signed)
1410-3013 PT to see pt this pm . Pt up in chair. Pt demonstrated IS correctly getting to 750 ml. Encouraged pt to use 10 times every hour. Reviewed sternal precautions, choosing heart healthy food choices and low carb, and staying in the tube,sternal precautions. Referred to [facility] since pt had CABG and MI. Pt encouraged to work with PT at [facility]. Put on discharge video for pt to view.  Pt voiced understanding of ed but may need reinforcement. Graylon Good RN BSN 05/15/2019 2:39 PM

## 2019-05-15 NOTE — Plan of Care (Signed)
Poc progressing.  

## 2019-05-15 NOTE — Progress Notes (Signed)
Pt refused to walk last night and this am. When tried to educate pt, she fell asleep. Will continue to monitor.

## 2019-05-15 NOTE — Progress Notes (Signed)
CSW has confirmed bed at [facility].    Reed Breech LCSWA [phone]

## 2019-05-15 NOTE — Progress Notes (Signed)
Report given to Talmage Coin, RN at [facility].  Pt ready for D/C, awaiting transfer via [facility].

## 2019-05-15 NOTE — Progress Notes (Signed)
Physical Therapy Treatment Patient Details Name: Stephanie [name] MRN: [id number] DOB: 01-09-1946 Today's Date: 05/15/2019    History of Present Illness 73 yo admitted to [facility] 6/25 with NSTEMI then tranferred to [facility] s/p CABG x 3 on 7/2. PMhx: CHF, UTI, Rt knee OA, HTN, COPD, PVD, DM    PT Comments    Pt needed encouragement to ambulate before going to bed.  Emphasis on extending the distance walked and stability/posture.   Follow Up Recommendations  SNF;Supervision/Assistance - 24 hour     Equipment Recommendations  None recommended by PT    Recommendations for Other Services       Precautions / Restrictions Precautions Precautions: Fall;Sternal Precaution Comments: reviewed precautions with pt, good recall of "tube" but cueing to adhere functionally     Mobility  Bed Mobility Overal bed mobility: Needs Assistance Bed Mobility: Sit to Sidelying         Sit to sidelying: Max assist General bed mobility comments: most assist needed getting feet back into bed.  Transfers Overall transfer level: Needs assistance   Transfers: Sit to/from Stand Sit to Stand: Mod assist         General transfer comment: cues and assist to get pt forward with minor boost.  Ambulation/Gait Ambulation/Gait assistance: Mod assist;+2 safety/equipment Gait Distance (Feet): 15 Feet(then 80 additional feet) Assistive device: Rolling walker (2 wheeled) Gait Pattern/deviations: Step-through pattern;Decreased stride length;Shuffle Gait velocity: slow   General Gait Details: cues for upright posture, proximity to RW.  Stability assist and worsening stability and fatigue as she got back to the bed.   Stairs             Wheelchair Mobility    Modified Rankin (Stroke Patients Only)       Balance Overall balance assessment: Needs assistance   Sitting balance-Leahy Scale: Fair       Standing balance-Leahy Scale: Poor Standing balance comment: reliant on bil UE  support in standing                            Cognition Arousal/Alertness: Awake/alert Behavior During Therapy: Flat affect Overall Cognitive Status: Impaired/Different from baseline Area of Impairment: Problem solving;Following commands;Safety/judgement;Memory                     Memory: Decreased short-term memory;Decreased recall of precautions Following Commands: Follows one step commands with increased time Safety/Judgement: Decreased awareness of safety;Decreased awareness of deficits   Problem Solving: Slow processing;Requires verbal cues        Exercises      General Comments        Pertinent Vitals/Pain Pain Assessment: No/denies pain    Home Living                      Prior Function            PT Goals (current goals can now be found in the care plan section) Acute Rehab PT Goals Patient Stated Goal: return to [place] PT Goal Formulation: With patient Time For Goal Achievement: 05/27/19 Potential to Achieve Goals: Fair Progress towards PT goals: Progressing toward goals    Frequency    Min 3X/week      PT Plan Current plan remains appropriate    Co-evaluation              AM-PAC PT "6 Clicks" Mobility   Outcome Measure  Help needed turning from your back  to your side while in a flat bed without using bedrails?: A Lot Help needed moving from lying on your back to sitting on the side of a flat bed without using bedrails?: Total Help needed moving to and from a bed to a chair (including a wheelchair)?: A Lot Help needed standing up from a chair using your arms (e.g., wheelchair or bedside chair)?: A Lot Help needed to walk in hospital room?: A Little Help needed climbing 3-5 steps with a railing? : Total 6 Click Score: 11    End of Session   Activity Tolerance: Patient tolerated treatment well;Patient limited by fatigue Patient left: in bed;with call bell/phone within reach Nurse Communication: Mobility  status;Precautions PT Visit Diagnosis: Other abnormalities of gait and mobility (R26.89);Muscle weakness (generalized) (M62.81);Unsteadiness on feet (R26.81)     Time: 1540-1610 PT Time Calculation (min) (ACUTE ONLY): 30 min  Charges:  $Gait Training: 8-22 mins $Therapeutic Activity: 8-22 mins                     05/15/2019  Stephanie [name], PT Acute Rehabilitation Services [phone]  (pager) [phone]  (office)   Stephanie [name] 05/15/2019, 5:45 PM

## 2019-05-21 ENCOUNTER — Telehealth: Payer: Self-pay | Admitting: Cardiology

## 2019-05-21 ENCOUNTER — Ambulatory Visit: Payer: Medicare Other | Admitting: Cardiology

## 2019-05-21 NOTE — Telephone Encounter (Signed)
New Message  ° ° ° ° °Unable to leave message, got busy signal  °Called to confirm appt and answer covid questions  °

## 2019-06-06 ENCOUNTER — Other Ambulatory Visit: Payer: Self-pay | Admitting: Thoracic Surgery (Cardiothoracic Vascular Surgery)

## 2019-06-06 DIAGNOSIS — Z951 Presence of aortocoronary bypass graft: Secondary | ICD-10-CM

## 2019-06-07 ENCOUNTER — Other Ambulatory Visit: Payer: Self-pay

## 2019-06-07 ENCOUNTER — Ambulatory Visit (INDEPENDENT_AMBULATORY_CARE_PROVIDER_SITE_OTHER): Payer: Self-pay | Admitting: Thoracic Surgery (Cardiothoracic Vascular Surgery)

## 2019-06-07 ENCOUNTER — Ambulatory Visit
Admission: RE | Admit: 2019-06-07 | Discharge: 2019-06-07 | Disposition: A | Discharge status: Home / Self Care | Payer: Medicare Other | Source: Ambulatory Visit | Attending: Thoracic Surgery (Cardiothoracic Vascular Surgery) | Admitting: Thoracic Surgery (Cardiothoracic Vascular Surgery)

## 2019-06-07 ENCOUNTER — Encounter: Payer: Self-pay | Admitting: Thoracic Surgery (Cardiothoracic Vascular Surgery)

## 2019-06-07 VITALS — BP 140/79 | HR 66 | Temp 97.8°F | Resp 20 | Ht 65.0 in | Wt 195.0 lb

## 2019-06-07 DIAGNOSIS — Z951 Presence of aortocoronary bypass graft: Secondary | ICD-10-CM

## 2019-06-07 DIAGNOSIS — I2510 Atherosclerotic heart disease of native coronary artery without angina pectoris: Secondary | ICD-10-CM

## 2019-06-07 NOTE — Progress Notes (Signed)
      [address]       [place] [address]             [id number]        Davey Renee [facility] [place] Medical Record #[id number] Date of Birth: Dec 03, 1945  Referring: Jerline Pain, MD Primary Care: Hedwig Morton, NP Primary Cardiologist:Mark Marlou Porch, MD  Chief Complaint:   Follow-up  History of Present Illness:     73 yo female s/p CABG on 7/2, returns for her 1st follow-up appt.  She has done well.  She is back in her nursing home.  She has no complaints today.     Physical Exam: BP 140/79   Pulse 66   Temp 97.8 F (36.6 C) (Skin)   Resp 20   Ht 5\' 5"  (1.651 m)   Wt 195 lb (88.5 kg)   LMP  (LMP Unknown)   SpO2 96%   BMI 32.45 kg/m   Alert NAD RRR, no murmur.  Incision CDI, well healed.  Sternum stable Abdomen soft, NT/ND no peripheral edema   Diagnostic Studies & Laboratory data: CXR: small Left effusion     Assessment / Plan:   73yo female s/p CABG.  Doing well  Clear for cardiac rehab RTC PRN   Lajuana Matte 06/07/2019 10:44 AM

## 2020-08-03 ENCOUNTER — Other Ambulatory Visit: Payer: Self-pay | Admitting: Thoracic Surgery (Cardiothoracic Vascular Surgery)

## 2020-08-03 DIAGNOSIS — Z951 Presence of aortocoronary bypass graft: Secondary | ICD-10-CM

## 2021-03-01 IMAGING — DX PORTABLE CHEST - 1 VIEW
1 series · 1 of 1 positions shown · non-contrast
Comparison: 05/08/2019

CLINICAL DATA: Status post coronary bypass grafting

EXAM:
PORTABLE CHEST 1 VIEW

[chest ap]
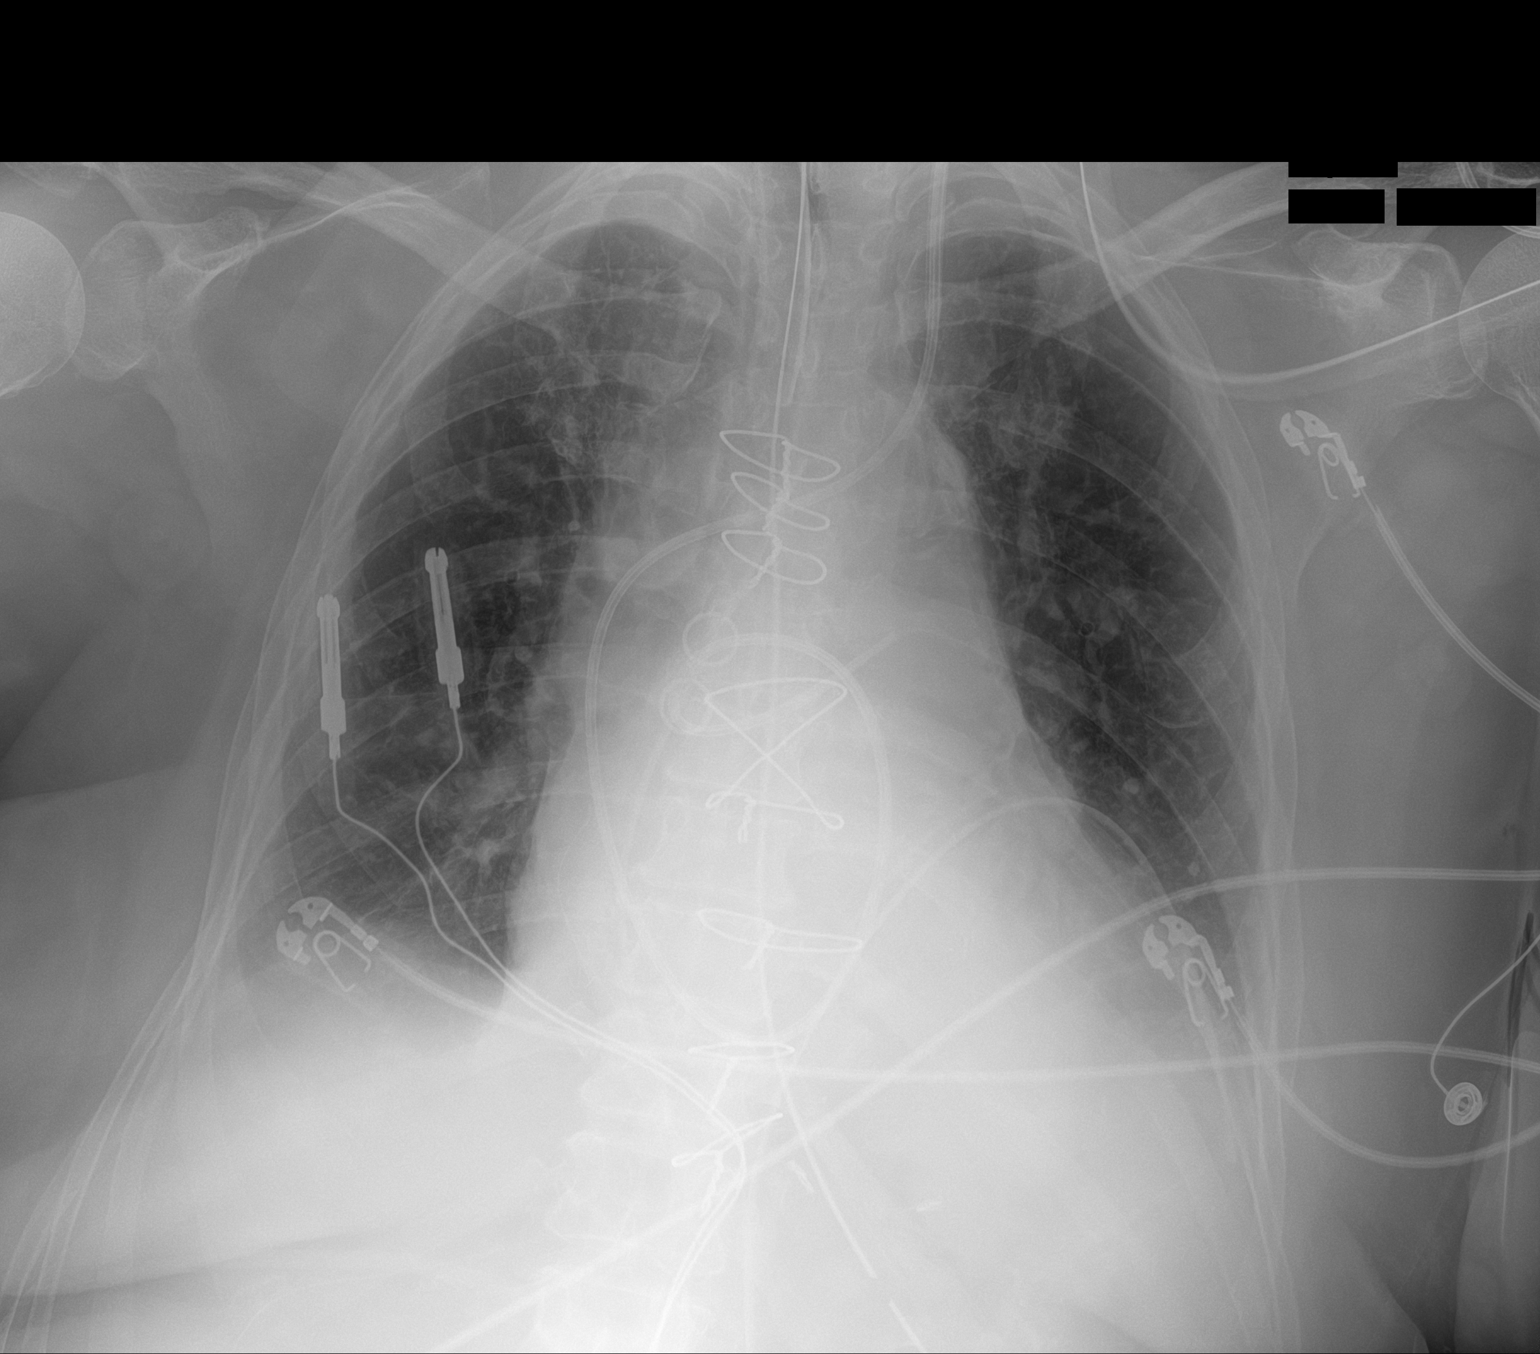

[1 of 1 positions shown; findings below may reference images not displayed]

FINDINGS: Postsurgical changes are now seen. Swan-Ganz catheter is coiled upon
itself within the right pulmonary artery. Endotracheal tube and
gastric catheter are noted in satisfactory position. Left
thoracostomy catheter and mediastinal drain are noted. Small right
pleural effusion is seen. No pneumothorax is seen. No focal
infiltrate is noted.
IMPRESSION: Tubes and lines as described above. The Swan-Ganz catheter could
likely be withdrawn somewhat to alleviate the loop in the right
pulmonary artery.

Small right pleural effusion.

## 2021-03-03 IMAGING — CR PORTABLE CHEST - 1 VIEW
1 series · 1 of 1 positions shown · non-contrast
Comparison: 05/10/2019

CLINICAL DATA: Follow-up CABG

EXAM:
PORTABLE CHEST 1 VIEW

[AP]
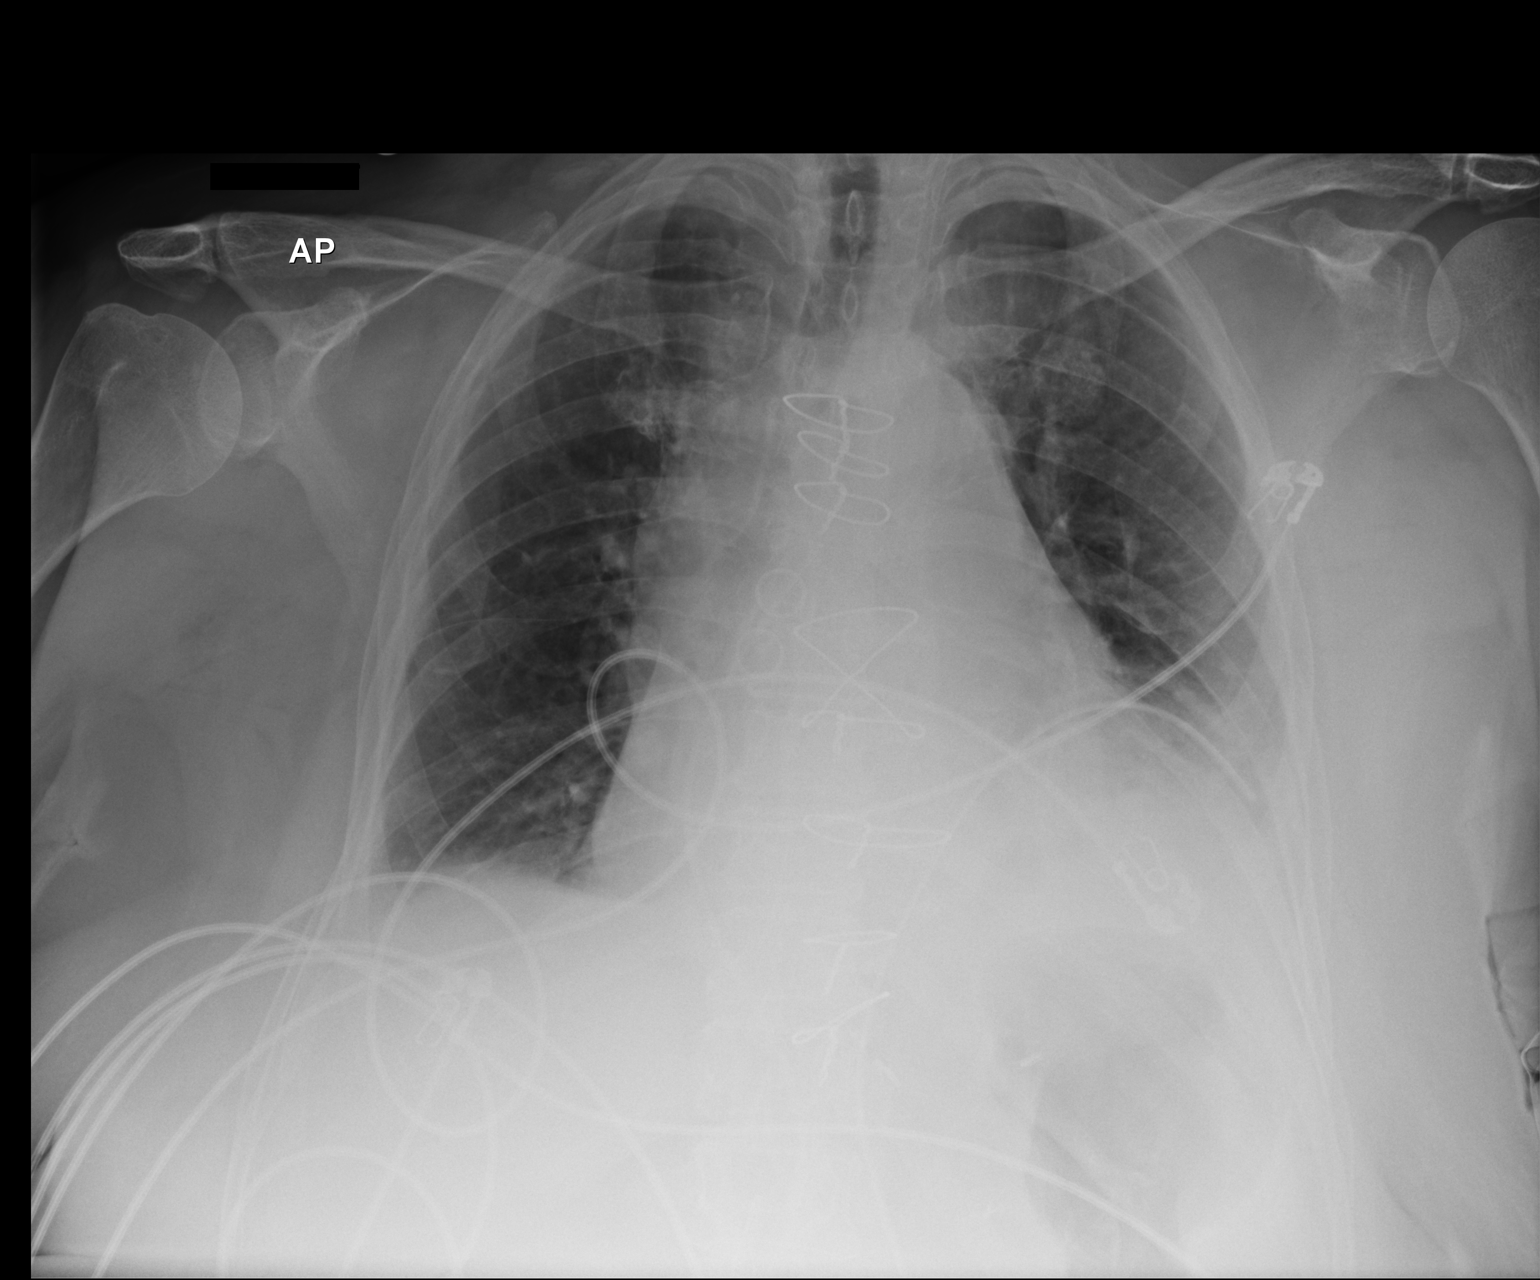

[1 of 1 positions shown; findings below may reference images not displayed]

FINDINGS: Left internal jugular Swan-Ganz catheter has been removed. Venous
access sheath remains in place. Chest tube at the left base remains
in place. Previous CABG. The right chest is clear. Persistent
atelectasis in the left lower lobe. No pneumothorax.
IMPRESSION: Swan-Ganz catheter removed. Persistent left lower lobe atelectasis.
No pneumothorax.

## 2021-03-04 IMAGING — CR CHEST - 2 VIEW
2 series · 2 of 2 positions shown · non-contrast
Comparison: 05/11/2019

CLINICAL DATA: Follow-up atelectasis

EXAM:
CHEST - 2 VIEW

[chest lat]
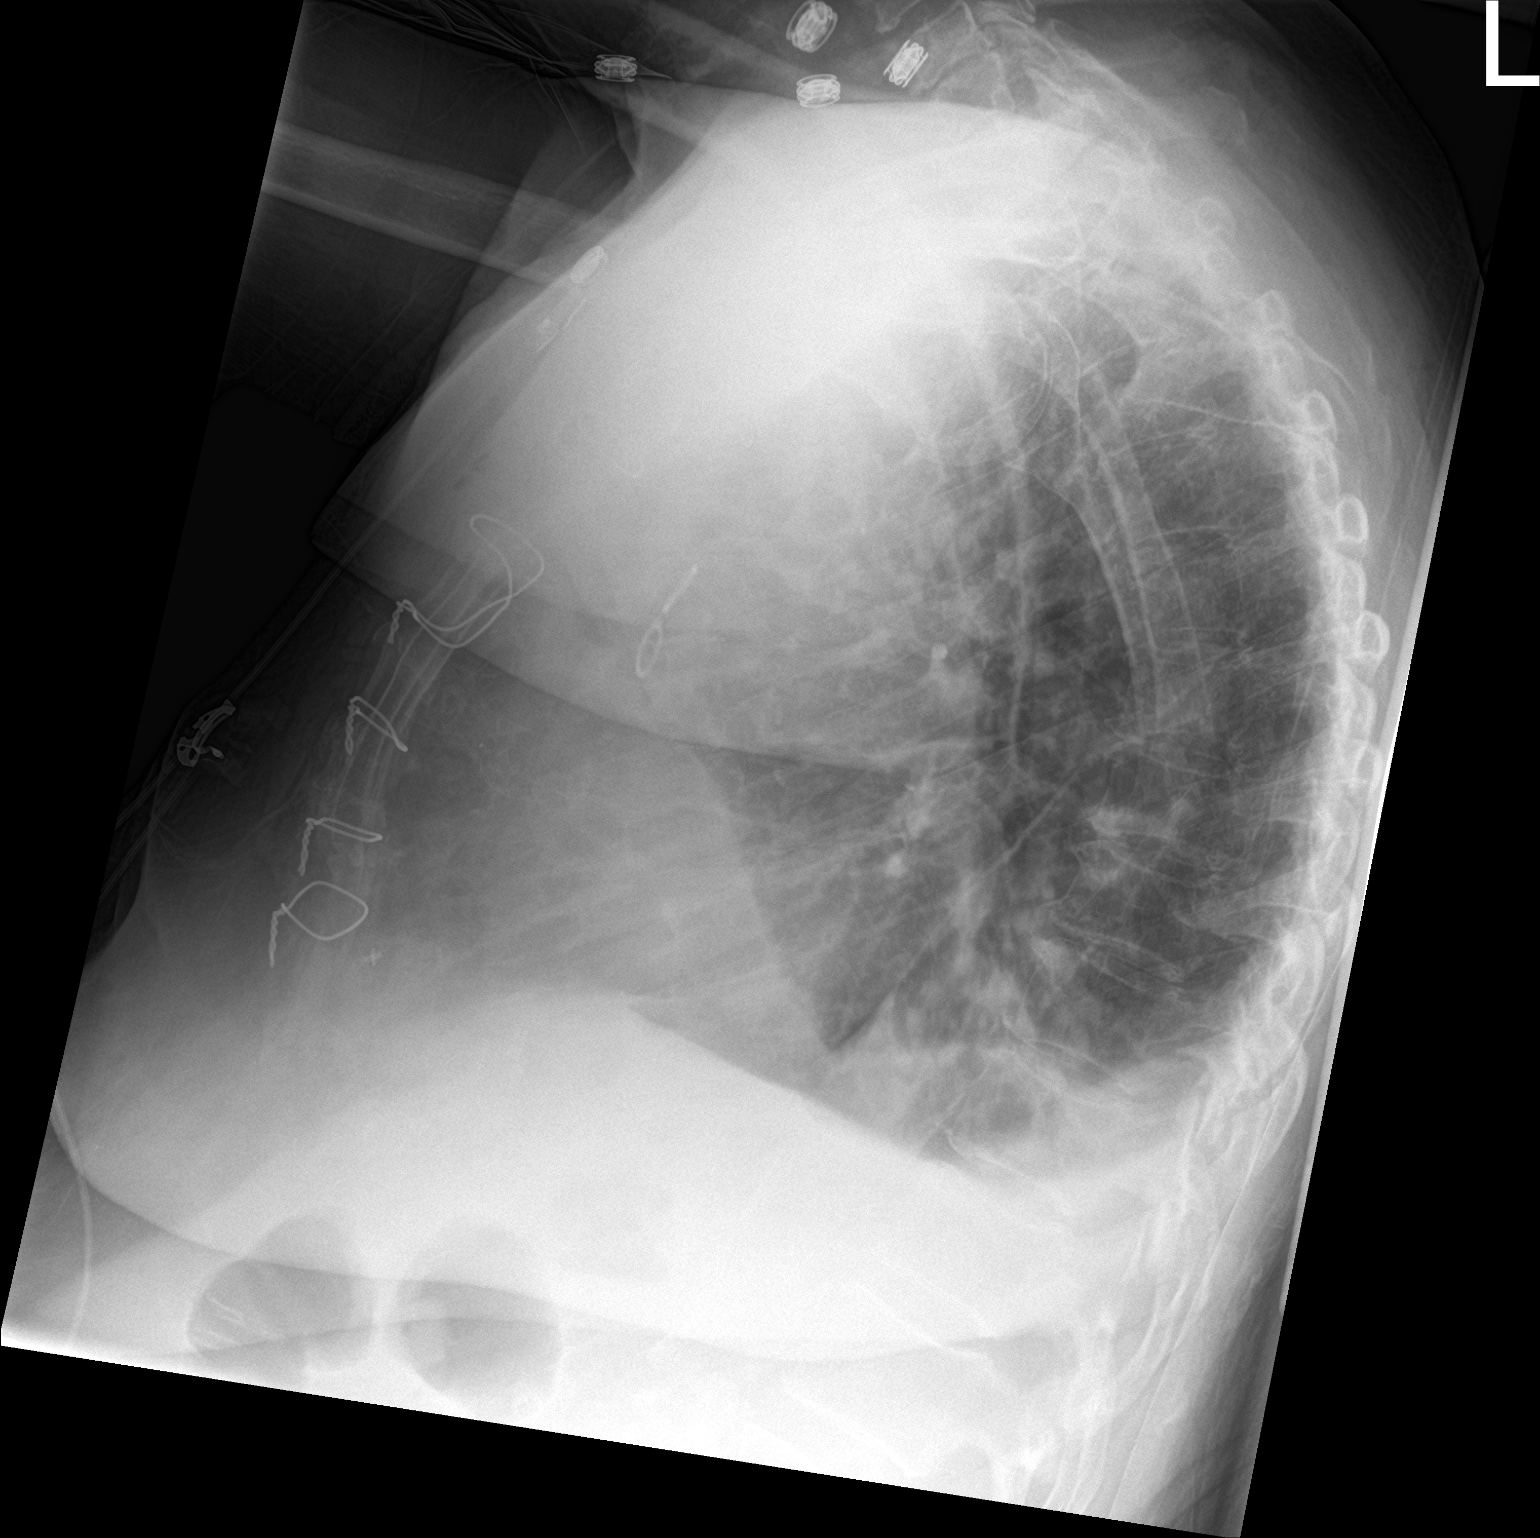

[chest ap]
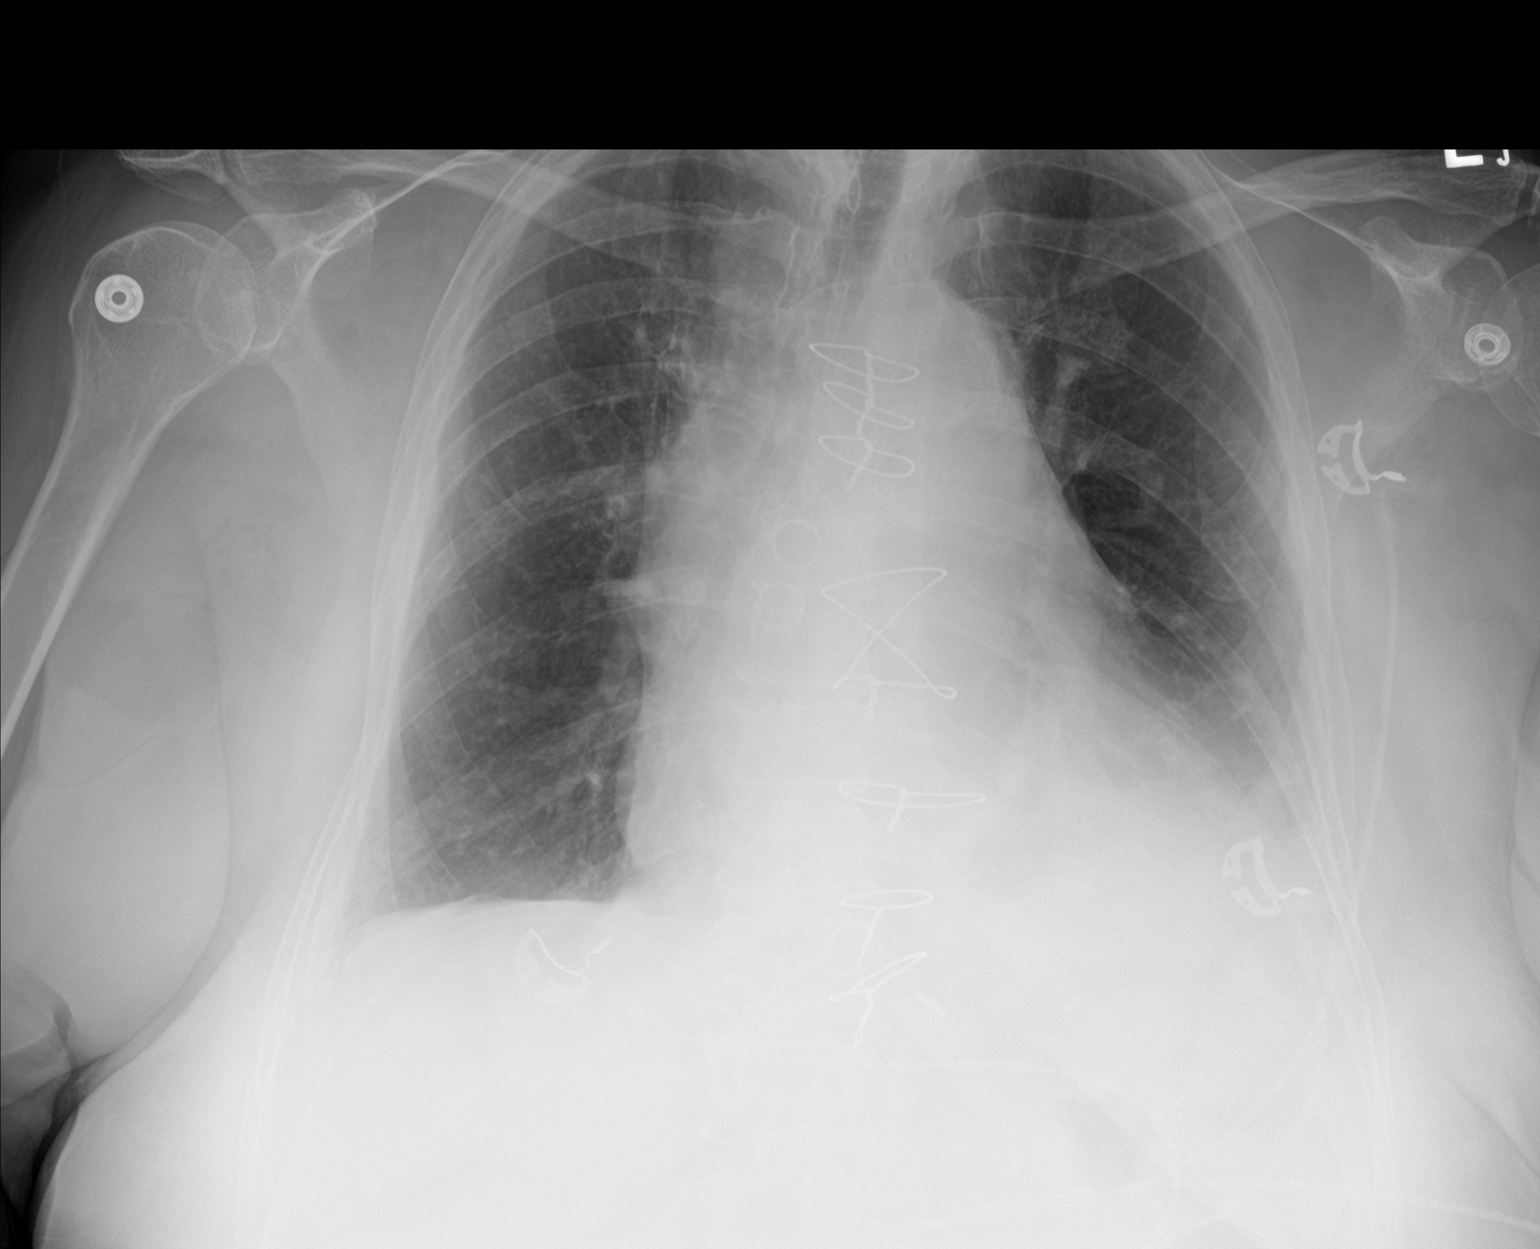

[2 of 2 positions shown; findings below may reference images not displayed]

FINDINGS: Previous median sternotomy and CABG. Left chest tube has been
removed. No pneumothorax. Right chest is clear. Persistent left
effusion and left lower lobe atelectasis.
IMPRESSION: Left chest tube removed. No pneumothorax. Persistent left lower lobe
atelectasis. Possible small left effusion.

## 2023-03-23 DIAGNOSIS — I340 Nonrheumatic mitral (valve) insufficiency: Secondary | ICD-10-CM

## 2023-04-11 ENCOUNTER — Encounter: Payer: Self-pay | Admitting: Internal Medicine

## 2023-05-01 DIAGNOSIS — I491 Atrial premature depolarization: Secondary | ICD-10-CM | POA: Diagnosis not present

## 2023-05-01 DIAGNOSIS — I4891 Unspecified atrial fibrillation: Secondary | ICD-10-CM | POA: Diagnosis not present

## 2023-05-02 DIAGNOSIS — I4891 Unspecified atrial fibrillation: Secondary | ICD-10-CM | POA: Diagnosis not present
